# Patient Record
Sex: Female | Born: 2000 | Race: White | Hispanic: No | Marital: Single | Smoking: Never smoker
Health system: Southern US, Community
[De-identification: ages and names within clinical notes are randomized; demographics above are authoritative.]

## PROBLEM LIST (undated history)

## (undated) DIAGNOSIS — E049 Nontoxic goiter, unspecified: Secondary | ICD-10-CM

## (undated) DIAGNOSIS — F419 Anxiety disorder, unspecified: Secondary | ICD-10-CM

## (undated) DIAGNOSIS — R112 Nausea with vomiting, unspecified: Secondary | ICD-10-CM

## (undated) DIAGNOSIS — E039 Hypothyroidism, unspecified: Secondary | ICD-10-CM

## (undated) DIAGNOSIS — E301 Precocious puberty: Secondary | ICD-10-CM

## (undated) DIAGNOSIS — F988 Other specified behavioral and emotional disorders with onset usually occurring in childhood and adolescence: Secondary | ICD-10-CM

## (undated) DIAGNOSIS — K589 Irritable bowel syndrome without diarrhea: Secondary | ICD-10-CM

## (undated) DIAGNOSIS — Z9889 Other specified postprocedural states: Secondary | ICD-10-CM

## (undated) DIAGNOSIS — E063 Autoimmune thyroiditis: Secondary | ICD-10-CM

## (undated) DIAGNOSIS — A1801 Tuberculosis of spine: Secondary | ICD-10-CM

## (undated) DIAGNOSIS — E282 Polycystic ovarian syndrome: Secondary | ICD-10-CM

## (undated) DIAGNOSIS — Q796 Ehlers-Danlos syndrome, unspecified: Secondary | ICD-10-CM

## (undated) DIAGNOSIS — R51 Headache: Secondary | ICD-10-CM

## (undated) DIAGNOSIS — Z8709 Personal history of other diseases of the respiratory system: Secondary | ICD-10-CM

## (undated) HISTORY — DX: Irritable bowel syndrome without diarrhea: K58.9

## (undated) HISTORY — PX: UMBILICAL HERNIA REPAIR: SHX196

## (undated) HISTORY — PX: MULTIPLE TOOTH EXTRACTIONS: SHX2053

## (undated) HISTORY — DX: Precocious puberty: E30.1

## (undated) HISTORY — DX: Nontoxic goiter, unspecified: E04.9

## (undated) HISTORY — DX: Headache: R51

## (undated) HISTORY — DX: Polycystic ovarian syndrome: E28.2

## (undated) HISTORY — DX: Hypothyroidism, unspecified: E03.9

## (undated) HISTORY — DX: Ehlers-Danlos syndrome, unspecified: Q79.60

## (undated) HISTORY — DX: Tuberculosis of spine: A18.01

---
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
## 2020-01-24 ENCOUNTER — Other Ambulatory Visit: Payer: Self-pay | Admitting: Pediatrics

## 2020-01-24 DIAGNOSIS — L700 Acne vulgaris: Secondary | ICD-10-CM

## 2020-02-22 ENCOUNTER — Telehealth: Payer: Self-pay | Admitting: Internal Medicine

## 2020-02-22 NOTE — Telephone Encounter (Signed)
Pt's mother is requesting a call back to discuss the pt's complications with the nurse. Pt's mother states the pt is not able to eat solid foods since she is not able to hold anything in her stomach.

## 2020-02-22 NOTE — Telephone Encounter (Signed)
Patient's mom reports that patient is continuing to have IBS complaints and constipation.  She can't eat solid food.  If she has liquids she feels well.  If she eats solid food she has terrible abdominal pain.  She is away at work camp and will return late next week.  She will come in and see Dr. Leone Payor on 03/10/20 to discuss before she returns to college

## 2020-03-07 ENCOUNTER — Telehealth: Payer: Self-pay | Admitting: Internal Medicine

## 2020-03-07 NOTE — Telephone Encounter (Signed)
Left message for patient to call back  

## 2020-03-08 NOTE — Telephone Encounter (Signed)
Patient reports that she is severely constipated.  Unable to eat or drink anything due to nausea and vomiting.  She has been using glycerine suppositories with little relief.  She has an office visit on Friday with Dr. Leone Payor .  She will try taking 1/4 to 1/2 of her phenergan dose on a schedule and try Pedialyte and Gatorade for fluid replacement.  Mom asked about a large volume enema.  Patient used to perform prescribed by her pediatric GI. Mom will try this and keep the appt for Friday with Dr. Leone Payor

## 2020-03-08 NOTE — Telephone Encounter (Signed)
Left message for patient to call back  

## 2020-03-10 ENCOUNTER — Ambulatory Visit (INDEPENDENT_AMBULATORY_CARE_PROVIDER_SITE_OTHER): Payer: Medicaid Other | Admitting: Internal Medicine

## 2020-03-10 ENCOUNTER — Encounter: Payer: Self-pay | Admitting: Internal Medicine

## 2020-03-10 ENCOUNTER — Other Ambulatory Visit (INDEPENDENT_AMBULATORY_CARE_PROVIDER_SITE_OTHER): Payer: Medicaid Other

## 2020-03-10 VITALS — BP 100/74 | HR 71 | Ht 65.0 in | Wt 119.5 lb

## 2020-03-10 DIAGNOSIS — R5381 Other malaise: Secondary | ICD-10-CM | POA: Diagnosis not present

## 2020-03-10 DIAGNOSIS — R5383 Other fatigue: Secondary | ICD-10-CM | POA: Diagnosis not present

## 2020-03-10 DIAGNOSIS — F5082 Avoidant/restrictive food intake disorder: Secondary | ICD-10-CM

## 2020-03-10 DIAGNOSIS — K5909 Other constipation: Secondary | ICD-10-CM

## 2020-03-10 DIAGNOSIS — R112 Nausea with vomiting, unspecified: Secondary | ICD-10-CM

## 2020-03-10 LAB — SEDIMENTATION RATE: Sed Rate: 6 mm/hr (ref 0–20)

## 2020-03-10 LAB — COMPREHENSIVE METABOLIC PANEL
ALT: 16 U/L (ref 0–35)
AST: 13 U/L (ref 0–37)
Albumin: 4 g/dL (ref 3.5–5.2)
Alkaline Phosphatase: 95 U/L (ref 47–119)
BUN: 9 mg/dL (ref 6–23)
CO2: 28 mEq/L (ref 19–32)
Calcium: 9.5 mg/dL (ref 8.4–10.5)
Chloride: 106 mEq/L (ref 96–112)
Creatinine, Ser: 0.57 mg/dL (ref 0.40–1.20)
GFR: 135.88 mL/min (ref >60.00)
Glucose, Bld: 75 mg/dL (ref 70–99)
Potassium: 4 mEq/L (ref 3.5–5.1)
Sodium: 141 mEq/L (ref 135–145)
Total Bilirubin: 0.4 mg/dL (ref 0.2–1.2)
Total Protein: 7.2 g/dL (ref 6.0–8.3)

## 2020-03-10 LAB — CBC WITH DIFFERENTIAL/PLATELET
Basophils Absolute: 0 10*3/uL (ref 0.0–0.1)
Basophils Relative: 0.6 % (ref 0.0–3.0)
Eosinophils Absolute: 0.2 10*3/uL (ref 0.0–0.7)
Eosinophils Relative: 2.9 % (ref 0.0–5.0)
HCT: 45.5 % (ref 36.0–49.0)
Hemoglobin: 15.2 g/dL (ref 12.0–16.0)
Lymphocytes Relative: 24.5 % (ref 24.0–48.0)
Lymphs Abs: 1.8 10*3/uL (ref 0.7–4.0)
MCHC: 33.4 g/dL (ref 31.0–37.0)
MCV: 91.4 fl (ref 78.0–98.0)
Monocytes Absolute: 0.5 10*3/uL (ref 0.1–1.0)
Monocytes Relative: 7.4 % (ref 3.0–12.0)
Neutro Abs: 4.7 10*3/uL (ref 1.4–7.7)
Neutrophils Relative %: 64.6 % (ref 43.0–71.0)
Platelets: 270 10*3/uL (ref 150.0–575.0)
RBC: 4.98 Mil/uL (ref 3.80–5.70)
RDW: 13 % (ref 11.4–15.5)
WBC: 7.2 10*3/uL (ref 4.5–13.5)

## 2020-03-10 LAB — C-REACTIVE PROTEIN: CRP: <1 mg/dL (ref 0.5–20.0)

## 2020-03-10 LAB — TSH: TSH: 1.48 u[IU]/mL (ref 0.40–5.00)

## 2020-03-10 NOTE — Progress Notes (Signed)
Breanna Gaines 19 y.o. 2001/03/11 [id number]  Assessment & Plan:   Encounter Diagnoses  Name Primary?  . Nausea and vomiting in adult Yes  . Malaise and fatigue   . Chronic constipation   . Nausea and vomiting, intractability of vomiting not specified, unspecified vomiting type     Patient does not look unwell does not look dehydrated.  This sounds like an exacerbation of chronic symptoms unfortunately.  She had a work-up last year that included an EGD colonoscopy and capsule endoscopy all of which were normal.  My plan is for her to do a Sitzmarks study given the constipation.  She was able to swallow the capsule given to her by my medical assistant.  KUB in 5 days.  Consider increasing Linzess dosing.  She can continue her baby food and force liquids electrolytes Gatorade etc.  Try to advance diet as tolerated.   Have also schedule a gastric emptying study.  Mirtazapine might be a good treatment in the setting of severe nausea etc.  Pending ruling out other disorders.  No need to repeat endoscopic work-up in my opinion.   Orders Placed This Encounter  Procedures  . NM Gastric Emptying  . DG Abd 1 View  . CBC with Differential/Platelet  . Comprehensive metabolic panel  . Sedimentation rate  . TSH  . C-reactive protein   Labs returned and are all normal.  CC: Aggie Hacker, MD   Subjective:   Chief Complaint: Vomiting nausea constipation  HPI The patient is a 19 year old woman here with her mother, she has a history of what I thought was IBS and functional disturbance last seen 03/02/2019, now with 2 weeks of unable to eat except small pouches of baby foods, constant nausea, upper and lower abdominal bloating and distention after eating and using 3 Dulcolax tablets to move her bowels about every other day.  Still on Linzess 145 mcg daily.  Says she cannot really swallow any regular food without regurgitating it.  Says she is lost 5 pounds in the last month.  It  sounds like she had symptoms throughout her college year last year, though she never sought medical help during that time.  Mother interjects with significant portions of the history as well and is concerned about whether or not she needs something to give her electrolytes because of terrible fatigue that is observed.  Patient is going to visit college friends today and this weekend coming up.  Has been taking Phenergan most nights to sleep and relieve nausea.  She was recently seen at her pediatrician's office for a viral syndrome and tested negative for Covid I am told.  Wt Readings from Last 3 Encounters:  03/10/20 119 lb 8 oz (54.2 kg) (34 %, Z= -0.42)*  03/02/19 113 lb 9.6 oz (51.5 kg) (26 %, Z= -0.65)*  12/17/18 105 lb (47.6 kg) (11 %, Z= -1.25)*   * Growth percentiles are based on CDC (Girls, 2-20 Years) data.     Allergies  Allergen Reactions  . Coconut Flavor   . Dairy Aid [Lactase]   . Gluten Meal Nausea Only  . Penicillins Rash   Current Meds  Medication Sig  . Amphetamine Sulfate 10 MG TABS TAKE 1/2 - 1 TABLETS BY MOUTH TWICE A DAY  . hyoscyamine (LEVSIN SL) 0.125 MG SL tablet DISSOLVE 1 TABLET ON THE TONGUE BEFORE MEALS FOR ABDOMINAL CRAMPING AND SPASMS.  Marland Kitchen ibuprofen (ADVIL) 400 MG tablet Take 400 mg by mouth every 6 (six) hours as needed.  Marland Kitchen  linaclotide (LINZESS) 145 MCG CAPS capsule Take 1 capsule (145 mcg total) by mouth daily before breakfast.  . Omeprazole (PRILOSEC PO) Take 1 tablet by mouth daily.  . ondansetron (ZOFRAN) 4 MG tablet Take 1 tab by mouth every 6 hours as needed for nausea and vomiting.  . promethazine (PHENERGAN) 12.5 MG tablet Take one-two every 6 -8 hours as needed for nausea  . rifaximin (XIFAXAN) 550 MG TABS tablet Take 1 tablet (550 mg total) by mouth 3 (three) times daily.  . sertraline (ZOLOFT) 50 MG tablet Take 1 tablet (50 mg total) by mouth daily.  Marland Kitchen spironolactone (ALDACTONE) 50 MG tablet TAKE 1 TABLET BY MOUTH TWICE A DAY  . SPRINTEC 28  0.25-35 MG-MCG tablet TAKE 1 TABLET BY MOUTH EVERY DAY   Current Facility-Administered Medications for the 03/10/20 encounter (Office Visit) with Iva Boop, MD  Medication  . ibuprofen (ADVIL,MOTRIN) tablet 400 mg   Past Medical History:  Diagnosis Date  . ADD (attention deficit disorder)   . Anxiety    while undergoing anesthesia with mask  . Goiter   . Hashimoto's thyroiditis   . Headache(784.0)   . History of asthma    has not required meds. in years  . Hypothyroidism   . IBS (irritable bowel syndrome) 11/06/2017  . Isosexual precocity   . Post-operative nausea and vomiting   . Precocious puberty 01/07/2011   Past Surgical History:  Procedure Laterality Date  . GIVENS CAPSULE STUDY N/A 03/04/2019   Procedure: GIVENS CAPSULE STUDY;  Surgeon: Iva Boop, MD;  Location: [facility] ENDOSCOPY;  Service: Endoscopy;  Laterality: N/A;  . MASS EXCISION Right 02/22/2013   Procedure: right arm implant removal  ;  Surgeon: Emelia Loron, MD;  Location: [facility];  Service: General;  Laterality: Right;  . MULTIPLE TOOTH EXTRACTIONS    . SUPPRELIN IMPLANT  02/12/2010  . TONSILLECTOMY AND ADENOIDECTOMY  2007  . UMBILICAL HERNIA REPAIR    . URACHAL CYST EXCISION  01/06/2001   Social History   Social History Narrative   Single, no children to graduate high school 2020.   Lives with parents.   No alcohol tobacco or drug use       family history includes Breast cancer in her maternal aunt and maternal grandmother; Cancer in her paternal grandmother; Dementia in her paternal grandfather; Early puberty in her mother; Hypertension in her maternal grandfather; Migraines in her maternal grandmother and mother; Transient ischemic attack in her mother.   Review of Systems As per HPI  Objective:   Physical Exam BP 100/74   Pulse 71   Ht 5\' 5"  (1.651 m)   Wt 119 lb 8 oz (54.2 kg)   BMI 19.89 kg/m  Petite young white woman no acute distress looks well mucous membranes are  moist.  No signs of jaundice. Lungs clear normal heart sounds Abdomen is soft nontender without organomegaly or mass  Data reviewed see HPI see assessment and plan

## 2020-03-10 NOTE — Patient Instructions (Addendum)
Your provider has requested that you go to the basement level for lab work before leaving today. Press "B" on the elevator. The lab is located at the first door on the left as you exit the elevato   Please continue foods as tolerated.   Today you are swallowing a Sitzmarker, Please read the package instructions, no laxatives until after you come back August 25th for an x-ray. Try to come around 11:00 AM. Thank you.    You have been scheduled for a gastric emptying scan at [facility] on 03/17/2020 at 8:30AM. Please arrive at least 15 minutes prior to your appointment for registration. Please make certain not to have anything to eat or drink after midnight the night before your test. Hold all stomach medications (ex: Zofran, phenergan, Reglan) 48 hours prior to your test. If you need to reschedule your appointment, please contact radiology scheduling at [phone]. _____________________________________________________________________ A gastric-emptying study measures how long it takes for food to move through your stomach. There are several ways to measure stomach emptying. In the most common test, you eat food that contains a small amount of radioactive material. A scanner that detects the movement of the radioactive material is placed over your abdomen to monitor the rate at which food leaves your stomach. This test normally takes about 4 hours to complete. _____________________________________________________________________  I appreciate the opportunity to care for you. Stan Head, MD, [facility]

## 2020-03-15 ENCOUNTER — Other Ambulatory Visit: Payer: Self-pay

## 2020-03-15 ENCOUNTER — Ambulatory Visit (INDEPENDENT_AMBULATORY_CARE_PROVIDER_SITE_OTHER)
Admission: RE | Admit: 2020-03-15 | Discharge: 2020-03-15 | Disposition: A | Discharge status: Home / Self Care | Payer: Medicaid Other | Source: Ambulatory Visit | Attending: Internal Medicine | Admitting: Internal Medicine

## 2020-03-15 DIAGNOSIS — K5909 Other constipation: Secondary | ICD-10-CM

## 2020-03-17 ENCOUNTER — Encounter (HOSPITAL_COMMUNITY)
Admission: RE | Admit: 2020-03-17 | Discharge: 2020-03-17 | Disposition: A | Discharge status: Home / Self Care | Payer: Medicaid Other | Source: Ambulatory Visit | Attending: Internal Medicine | Admitting: Internal Medicine

## 2020-03-17 ENCOUNTER — Other Ambulatory Visit: Payer: Self-pay

## 2020-03-17 DIAGNOSIS — R112 Nausea with vomiting, unspecified: Secondary | ICD-10-CM | POA: Diagnosis not present

## 2020-03-17 MED ORDER — TECHNETIUM TC 99M SULFUR COLLOID
1.9000 | Freq: Once | INTRAVENOUS | Status: AC | PRN
  Administered 2020-03-17: 1.9 via INTRAVENOUS

## 2020-06-12 ENCOUNTER — Other Ambulatory Visit: Payer: Self-pay | Admitting: Family

## 2020-06-12 ENCOUNTER — Other Ambulatory Visit: Payer: Self-pay | Admitting: Internal Medicine

## 2020-06-12 DIAGNOSIS — L700 Acne vulgaris: Secondary | ICD-10-CM

## 2020-06-12 DIAGNOSIS — N914 Secondary oligomenorrhea: Secondary | ICD-10-CM

## 2020-07-24 ENCOUNTER — Telehealth: Payer: Self-pay | Admitting: Internal Medicine

## 2020-07-24 MED ORDER — PROMETHAZINE HCL 25 MG RE SUPP: 25.0000 mg | Freq: Two times a day (BID) | RECTAL | 0 refills | Status: AC | PRN

## 2020-07-24 NOTE — Telephone Encounter (Signed)
Yes, that would be fine.  Phenergan suppository 25 mg  Use 1 every 12 hours as needed for nausea and vomiting  Dispense 12, refill 0   - HD

## 2020-07-24 NOTE — Telephone Encounter (Signed)
rx sent Mom notified

## 2020-07-24 NOTE — Telephone Encounter (Signed)
Inbound call from patient's mother stating patient has been vomiting, has stomach pain and has not had a bowel movement on a couple of days.  Please advise.

## 2020-07-24 NOTE — Telephone Encounter (Signed)
Dr. Leone Payor patient with a continued hx of chronic constipation.  She has stopped the linzess a few months ago due to not working.  She is scheduled to see Doug Sou, PA on Wed on 07/26/20.  Patient has not had a BM in 10 days. Only thing that helps her have a BM is glycerine suppositories and enemas.  She is vomiting.  Mom is requesting phenergan suppository.  She has po phenergan and zofran at home, but can't keep them down.  Dr. Myrtie Neither, you are MD of the day.  okay to send in phenergan suppository?

## 2020-07-26 ENCOUNTER — Encounter: Payer: Self-pay | Admitting: Gastroenterology

## 2020-07-26 ENCOUNTER — Ambulatory Visit (INDEPENDENT_AMBULATORY_CARE_PROVIDER_SITE_OTHER): Payer: Medicaid Other | Admitting: Gastroenterology

## 2020-07-26 VITALS — BP 100/70 | HR 88 | Ht 64.57 in | Wt 118.2 lb

## 2020-07-26 DIAGNOSIS — R1084 Generalized abdominal pain: Secondary | ICD-10-CM | POA: Diagnosis not present

## 2020-07-26 DIAGNOSIS — K5909 Other constipation: Secondary | ICD-10-CM

## 2020-07-26 DIAGNOSIS — R112 Nausea with vomiting, unspecified: Secondary | ICD-10-CM

## 2020-07-26 MED ORDER — LINACLOTIDE 290 MCG PO CAPS: 290.0000 ug | ORAL_CAPSULE | Freq: Every day | ORAL | 5 refills | Status: DC

## 2020-07-26 NOTE — Progress Notes (Signed)
07/26/2020 OMYA WINFIELD [id number] 2000/12/24   HISTORY OF PRESENT ILLNESS: This is a 20 year old female who is a patient of Dr. Marvell Fuller.  She presents here today with her mother for ongoing complaints of nausea, vomiting, and constipation, abdominal pain.  She has had all of the symptoms for years.  Previously saw pediatric GI and is now here.  EGD, colonoscopy, capsule endoscopy, ultrasounds, CT scans, gastric emptying scan have all been unremarkable except for constipation.  Sitz marker study showed large colonic stool burden.  She tried Linzess 145 mcg daily in the past and says that it worked for a while, but then stopped working so she discontinued it.  She says that MiraLAX previously did not work at all.  She hass tried changing her diet several different ways without success.  She tells me that her last bowel movement was 8 or 9 days ago.  She says that she has nausea all the time and relies on Phenergan and Zofran every 6 hours.  She is not able to eat anything except for pured fruits and vegetables or babyfood type products without vomiting or having abdominal pain.  Her weight is down a pound from her last visit with Dr. Leone Payor in August.  Her mother reports that they are frustrated and wondering at which point we will refer to another type of specialist.    Past Medical History:  Diagnosis Date  . ADD (attention deficit disorder)   . Anxiety    while undergoing anesthesia with mask  . Goiter   . Hashimoto's thyroiditis   . Headache(784.0)   . History of asthma    has not required meds. in years  . Hypothyroidism   . IBS (irritable bowel syndrome) 11/06/2017  . Isosexual precocity   . Post-operative nausea and vomiting   . Precocious puberty 01/07/2011   Past Surgical History:  Procedure Laterality Date  . GIVENS CAPSULE STUDY N/A 03/04/2019   Procedure: GIVENS CAPSULE STUDY;  Surgeon: Iva Boop, MD;  Location: [facility] ENDOSCOPY;  Service: Endoscopy;   Laterality: N/A;  . MASS EXCISION Right 02/22/2013   Procedure: right arm implant removal  ;  Surgeon: Emelia Loron, MD;  Location: [facility];  Service: General;  Laterality: Right;  . MULTIPLE TOOTH EXTRACTIONS    . SUPPRELIN IMPLANT  02/12/2010  . TONSILLECTOMY AND ADENOIDECTOMY  2007  . UMBILICAL HERNIA REPAIR    . URACHAL CYST EXCISION  01/06/2001    reports that she has never smoked. She has never used smokeless tobacco. She reports that she does not drink alcohol and does not use drugs. family history includes Breast cancer in her maternal aunt and maternal grandmother; Cancer in her paternal grandmother; Dementia in her paternal grandfather; Early puberty in her mother; Hypertension in her maternal grandfather; Migraines in her maternal grandmother and mother; Transient ischemic attack in her mother. Allergies  Allergen Reactions  . Coconut Flavor   . Dairy Aid [Lactase]   . Gluten Meal Nausea Only  . Penicillins Rash      Outpatient Encounter Medications as of 07/26/2020  Medication Sig  . Amphetamine Sulfate 10 MG TABS TAKE 1/2 - 1 TABLETS BY MOUTH TWICE A DAY  . hyoscyamine (LEVSIN SL) 0.125 MG SL tablet DISSOLVE 1 TABLET ON THE TONGUE BEFORE MEALS FOR ABDOMINAL CRAMPING AND SPASMS.  Marland Kitchen ibuprofen (ADVIL) 400 MG tablet Take 400 mg by mouth every 6 (six) hours as needed.  . Omeprazole (PRILOSEC PO) Take 1 tablet by  mouth daily.  . ondansetron (ZOFRAN) 4 MG tablet Take 1 tab by mouth every 6 hours as needed for nausea and vomiting.  . promethazine (PHENERGAN) 12.5 MG tablet TAKE ONE-TWO TABS EVERY 6 -8 HOURS AS NEEDED FOR NAUSEA  . promethazine (PHENERGAN) 25 MG suppository Place 1 suppository (25 mg total) rectally every 12 (twelve) hours as needed for nausea or vomiting.  . rifaximin (XIFAXAN) 550 MG TABS tablet Take 1 tablet (550 mg total) by mouth 3 (three) times daily.  . sertraline (ZOLOFT) 50 MG tablet Take 1 tablet (50 mg total) by mouth daily.  Marland Kitchen  spironolactone (ALDACTONE) 50 MG tablet TAKE 1 TABLET BY MOUTH TWICE A DAY  . SPRINTEC 28 0.25-35 MG-MCG tablet TAKE 1 TABLET BY MOUTH EVERY DAY  . [DISCONTINUED] linaclotide (LINZESS) 145 MCG CAPS capsule Take 1 capsule (145 mcg total) by mouth daily before breakfast.   Facility-Administered Encounter Medications as of 07/26/2020  Medication  . ibuprofen (ADVIL,MOTRIN) tablet 400 mg     REVIEW OF SYSTEMS  : All other systems reviewed and negative except where noted in the History of Present Illness.   PHYSICAL EXAM: BP 100/70 (BP Location: Left Arm, Patient Position: Sitting, Cuff Size: Normal)   Pulse 88   Ht 5' 4.57" (1.64 m) Comment: height measured without shoes  Wt 118 lb 4 oz (53.6 kg)   LMP 07/19/2020   BMI 19.94 kg/m  General: Well developed white female in no acute distress Head: Normocephalic and atraumatic Eyes:  Sclerae anicteric, conjunctiva pink. Ears: Normal auditory acuity Lungs: Clear throughout to auscultation; no W/R/R. Heart: Regular rate and rhythm; no M/R/G. Abdomen: Soft, non-distended.  BS present.  Mild diffuse TTP. Musculoskeletal: Symmetrical with no gross deformities  Skin: No lesions on visible extremities Extremities: No edema  Neurological: Alert oriented x 4, grossly non-focal Psychological:  Alert and cooperative. Normal mood and affect  ASSESSMENT AND PLAN: *20 year old female with chronic complaints of nausea and vomiting, constipation, abdominal pain: Symptoms ongoing for years. Previously seen peds GI and is now here. EGD, colonoscopy, video capsule endoscopy, gastric emptying scan, CT scans, ultrasounds all been unremarkable except for showing severe constipation. Sitz marker study was positive for large stool burden. Linzess 145 mcg helped previously for short time. We discussed trying this again at a higher dose. Also have lots of other options including Amitiza, Trulance, Movantik, etc. I have asked her to do a bowel cleanse tonight she was  given a bowel prep sample. She will also use some glycerin suppositories or fleets enema this evening and then begin her Linzess daily tomorrow. She was given samples and prescription was sent to her pharmacy. We will also check a HIDA scan due to her complaints of nausea, vomiting, and abdominal pain with eating anything except for pured fruits and vegetables/baby foods.   CC:  Aggie Hacker, MD

## 2020-07-26 NOTE — Patient Instructions (Addendum)
If you are age 20 or older, your body mass index should be between 23-30. Your Body mass index is 19.94 kg/m. If this is out of the aforementioned range listed, please consider follow up with your Primary Care Provider.  If you are age 7 or younger, your body mass index should be between 19-25. Your Body mass index is 19.94 kg/m. If this is out of the aformentioned range listed, please consider follow up with your Primary Care Provider.   We have sent the following medications to your pharmacy for you to pick up at your convenience: Linzess 290 ,mcg  You have been scheduled for a HIDA scan at [facility] (1st floor) on 08/14/20. Please arrive 15 minutes prior to your scheduled appointment at  7:30. Make certain not to have anything to eat or drink at least 6 hours prior to your test. Should this appointment date or time not work well for you, please call radiology scheduling at [phone].  _____________________________________________________________________ hepatobiliary (HIDA) scan is an imaging procedure used to diagnose problems in the liver, gallbladder and bile ducts. In the HIDA scan, a radioactive chemical or tracer is injected into a vein in your arm. The tracer is handled by the liver like bile. Bile is a fluid produced and excreted by your liver that helps your digestive system break down fats in the foods you eat. Bile is stored in your gallbladder and the gallbladder releases the bile when you eat a meal. A special nuclear medicine scanner (gamma camera) tracks the flow of the tracer from your liver into your gallbladder and small intestine.  During your HIDA scan  You'll be asked to change into a hospital gown before your HIDA scan begins. Your health care team will position you on a table, usually on your back. The radioactive tracer is then injected into a vein in your arm.The tracer travels through your bloodstream to your liver, where it's taken up by the bile-producing  cells. The radioactive tracer travels with the bile from your liver into your gallbladder and through your bile ducts to your small intestine.You may feel some pressure while the radioactive tracer is injected into your vein. As you lie on the table, a special gamma camera is positioned over your abdomen taking pictures of the tracer as it moves through your body. The gamma camera takes pictures continually for about an hour. You'll need to keep still during the HIDA scan. This can become uncomfortable, but you may find that you can lessen the discomfort by taking deep breaths and thinking about other things. Tell your health care team if you're uncomfortable. The radiologist will watch on a computer the progress of the radioactive tracer through your body. The HIDA scan may be stopped when the radioactive tracer is seen in the gallbladder and enters your small intestine. This typically takes about an hour. In some cases extra imaging will be performed if original images aren't satisfactory, if morphine is given to help visualize the gallbladder or if the medication CCK is given to look at the contraction of the gallbladder. This test typically takes 2 hours to complete. ________________________________________________________________________  Start the bowel prep this evening w/enema or suppositories.  Thank you for choosing me and [facility].  Breanna Gaines

## 2020-08-14 ENCOUNTER — Encounter (HOSPITAL_COMMUNITY): Admission: RE | Admit: 2020-08-14 | Payer: Medicaid Other | Source: Ambulatory Visit

## 2020-10-17 ENCOUNTER — Telehealth: Payer: Self-pay | Admitting: Internal Medicine

## 2020-10-23 NOTE — Telephone Encounter (Signed)
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

## 2020-10-26 ENCOUNTER — Other Ambulatory Visit: Payer: Self-pay

## 2020-10-26 ENCOUNTER — Ambulatory Visit (INDEPENDENT_AMBULATORY_CARE_PROVIDER_SITE_OTHER): Payer: Medicaid Other | Admitting: Pediatrics

## 2020-10-26 ENCOUNTER — Other Ambulatory Visit (HOSPITAL_COMMUNITY)
Admission: RE | Admit: 2020-10-26 | Discharge: 2020-10-26 | Disposition: A | Discharge status: Home / Self Care | Payer: Medicaid Other | Source: Ambulatory Visit | Attending: Pediatrics | Admitting: Pediatrics

## 2020-10-26 ENCOUNTER — Encounter: Payer: Self-pay | Admitting: Pediatrics

## 2020-10-26 VITALS — BP 118/76 | HR 97 | Ht 65.0 in | Wt 118.8 lb

## 2020-10-26 DIAGNOSIS — Z113 Encounter for screening for infections with a predominantly sexual mode of transmission: Secondary | ICD-10-CM

## 2020-10-26 DIAGNOSIS — K5909 Other constipation: Secondary | ICD-10-CM

## 2020-10-26 DIAGNOSIS — F900 Attention-deficit hyperactivity disorder, predominantly inattentive type: Secondary | ICD-10-CM

## 2020-10-26 DIAGNOSIS — G90A Postural orthostatic tachycardia syndrome (POTS): Secondary | ICD-10-CM

## 2020-10-26 DIAGNOSIS — R112 Nausea with vomiting, unspecified: Secondary | ICD-10-CM

## 2020-10-26 DIAGNOSIS — L680 Hirsutism: Secondary | ICD-10-CM | POA: Diagnosis not present

## 2020-10-26 DIAGNOSIS — E282 Polycystic ovarian syndrome: Secondary | ICD-10-CM

## 2020-10-26 DIAGNOSIS — I498 Other specified cardiac arrhythmias: Secondary | ICD-10-CM

## 2020-10-26 DIAGNOSIS — L700 Acne vulgaris: Secondary | ICD-10-CM | POA: Diagnosis not present

## 2020-10-26 DIAGNOSIS — F411 Generalized anxiety disorder: Secondary | ICD-10-CM

## 2020-10-26 MED ORDER — LUBIPROSTONE 24 MCG PO CAPS: 24.0000 ug | ORAL_CAPSULE | Freq: Two times a day (BID) | ORAL | 1 refills | Status: DC

## 2020-10-26 MED ORDER — POLYETHYLENE GLYCOL 3350 17 GM/SCOOP PO POWD: 1.0000 | Freq: Once | ORAL | 0 refills | Status: AC

## 2020-10-26 MED ORDER — AMPHETAMINE SULFATE 10 MG PO TABS: ORAL_TABLET | ORAL | 0 refills | Status: DC

## 2020-10-26 MED ORDER — SERTRALINE HCL 50 MG PO TABS: 50.0000 mg | ORAL_TABLET | Freq: Every day | ORAL | 1 refills | Status: DC

## 2020-10-26 MED ORDER — NORELGESTROMIN-ETH ESTRADIOL 150-35 MCG/24HR TD PTWK: 1.0000 | MEDICATED_PATCH | TRANSDERMAL | 3 refills | Status: DC

## 2020-10-26 MED ORDER — AMITRIPTYLINE HCL 10 MG PO TABS: ORAL_TABLET | ORAL | 1 refills | Status: DC

## 2020-10-26 NOTE — Progress Notes (Signed)
POTS vitals collected

## 2020-10-26 NOTE — Progress Notes (Signed)
History was provided by the patient.  Breanna Gaines is a 20 y.o. female who is here for PCOS, anxiety, ADHD, acne, hirsutism.  Breanna Richele Strand, MD   HPI:  Pt reports that she is doing well. 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 Living on campus all 4 years.   She is still having really severe GI problems. She is going in 2 weeks to [facility] to see someone new. Got sick sophomore year of high school- this is when we started OCP. Nauseous and throwing up most nights. Says coffee helps with her stomach.   She is not remembering her birth control on a regular basis. Her stomach problem does not seem to be improved on the days where she doesn't take it.   Anxiety 2/10. Feels like zoloft has continued to work well for her anxiety. She has continued evekeo for ADHD, though not consistently.   She has been passing out a lot recently. This school year she has passed out 6-7 times. She gets really dizzy, nauseous, hot, and has to lay down and seems to have heart racing. She drinks about 36-40 oz of water during the day + some at meals. Coffee in the AM. No regular ETOH, sports drinks or sodas.  In the last week, only had zofran once or twice. Phenergan once or twice. Has not taken linzess this week. Reports GI told her to use it on a week to week basis. When she was on the 290 mcg she took for 1 week- had a few days of diarrhea and then reports it stopped working abruptly so she stopped it. Last BM was on Monday. Last vomit 2 nights ago, usually overnight.   Last migraine a long time ago.    Stopped taking spiro a while ago in an effort to minimize overall # of medications   No LMP recorded.  Review of Systems  Constitutional: Positive for malaise/fatigue.  Eyes: Negative for double vision.  Respiratory: Negative for shortness of breath.   Cardiovascular: Negative for chest pain and palpitations.  Gastrointestinal: Positive for abdominal pain, constipation, nausea and vomiting.  Negative for diarrhea.  Genitourinary: Negative for dysuria.  Musculoskeletal: Negative for joint pain and myalgias.  Skin: Negative for rash.  Neurological: Positive for dizziness. Negative for headaches.  Endo/Heme/Allergies: Does not bruise/bleed easily.    Patient Active Problem List   Diagnosis Date Noted  . Chronic constipation 07/26/2020  . Generalized abdominal pain 07/26/2020  . Nausea and vomiting in adult 12/17/2018  . Avoidant-restrictive food intake disorder (ARFID) ??? 11/05/2018  . Food intolerance 12/12/2017  . Hirsutism 12/12/2017  . Acne 12/11/2017  . IBS (irritable bowel syndrome) 11/06/2017  . Folliculitis 06/26/2015  . Oligomenorrhea 05/03/2014  . Chronic tension type headache 12/28/2013  . Migraine without aura, without mention of intractable migraine without mention of status migrainosus 12/28/2013  . Panic disorder 12/28/2013  . Thyroiditis, autoimmune   . Goiter, unspecified 01/07/2011    Current Outpatient Medications on File Prior to Visit  Medication Sig Dispense Refill  . Amphetamine Sulfate 10 MG TABS TAKE 1/2 - 1 TABLETS BY MOUTH TWICE A DAY  0  . hyoscyamine (LEVSIN SL) 0.125 MG SL tablet DISSOLVE 1 TABLET ON THE TONGUE BEFORE MEALS FOR ABDOMINAL CRAMPING AND SPASMS. 90 tablet 2  . ibuprofen (ADVIL) 400 MG tablet Take 400 mg by mouth every 6 (six) hours as needed.    . linaclotide (LINZESS) 290 MCG CAPS capsule Take 1 capsule (290 mcg total) by  mouth daily. 30 capsule 5  . Omeprazole (PRILOSEC PO) Take 1 tablet by mouth daily.    . ondansetron (ZOFRAN) 4 MG tablet Take 1 tab by mouth every 6 hours as needed for nausea and vomiting. 100 tablet 0  . promethazine (PHENERGAN) 12.5 MG tablet TAKE ONE-TWO TABS EVERY 6 -8 HOURS AS NEEDED FOR NAUSEA 45 tablet 0  . promethazine (PHENERGAN) 25 MG suppository Place 1 suppository (25 mg total) rectally every 12 (twelve) hours as needed for nausea or vomiting. 12 each 0  . rifaximin (XIFAXAN) 550 MG TABS tablet  Take 1 tablet (550 mg total) by mouth 3 (three) times daily. 42 tablet 0  . sertraline (ZOLOFT) 50 MG tablet Take 1 tablet (50 mg total) by mouth daily.    Marland Kitchen spironolactone (ALDACTONE) 50 MG tablet TAKE 1 TABLET BY MOUTH TWICE A DAY 60 tablet 5  . SPRINTEC 28 0.25-35 MG-MCG tablet TAKE 1 TABLET BY MOUTH EVERY DAY 84 tablet 3   Current Facility-Administered Medications on File Prior to Visit  Medication Dose Route Frequency Provider Last Rate Last Admin  . ibuprofen (ADVIL,MOTRIN) tablet 400 mg  400 mg Oral 4 times per day Owens Shark, MD        Allergies  Allergen Reactions  . Coconut Flavor   . Dairy Aid [Lactase]   . Gluten Meal Nausea Only  . Penicillins Rash    Social History: Confidentiality was discussed with the patient and if applicable, with caregiver as well. Tobacco: none Secondhand smoke exposure? no Drugs/EtOH: ETOH occasionally, no MJ Sexually active? no  Safety: safe at home and school  Last STI Screening:today  Pregnancy Prevention: ocp  Physical Exam:    Vitals:   10/26/20 1144  BP: 118/85  Pulse: 79  Weight: 118 lb 12.8 oz (53.9 kg)  Height: 5\' 5"  (1.651 m)    Growth percentile SmartLinks can only be used for patients less than 65 years old.  Physical Exam Vitals and nursing note reviewed.  Constitutional:      General: She is not in acute distress.    Appearance: She is well-developed.  Neck:     Thyroid: No thyromegaly.  Cardiovascular:     Rate and Rhythm: Normal rate and regular rhythm.     Heart sounds: No murmur heard.   Pulmonary:     Breath sounds: Normal breath sounds.  Abdominal:     Palpations: Abdomen is soft. There is no mass.     Tenderness: There is no abdominal tenderness. There is no guarding.  Musculoskeletal:     Right lower leg: No edema.     Left lower leg: No edema.  Lymphadenopathy:     Cervical: No cervical adenopathy.  Skin:    General: Skin is warm.     Findings: No rash.  Neurological:     Mental  Status: She is alert.     Comments: No tremor  Psychiatric:        Mood and Affect: Mood and affect normal.     Assessment/Plan: 1. PCOS (polycystic ovarian syndrome) Would like to switch to patch so she can have consistent hormones given that sometimes she forgets and sometimes she vomits after dose. We will try continuous cycling patch given that her severe stomach symptoms used to occur in monthly cycles to address if they are at all related to a catamenial response.  - norelgestromin-ethinyl estradiol (ORTHO EVRA) 150-35 MCG/24HR transdermal patch; Place 1 patch onto the skin once a week. Place 1 patch  onto the skin once weekly. Do not skip a week, wear patch weekly for continuous cycling  Dispense: 16 patch; Refill: 3  2. Hirsutism Stable. Not currently on spiro.   3. Acne vulgaris Stable.   4. Routine screening for STI (sexually transmitted infection) Per protocol.  - Urine cytology ancillary only  5. Chronic constipation Not taking linzess consistently. Discussed in light of POTS dx today and correlation between POTS and autonomic dysfunction of the GI tract we should find something that she can stay on and take consistently to help with this, not on a week to week basis. She was in agreement. She will see GI at [facility] in 2 weeks as well.  - lubiprostone (AMITIZA) 24 MCG capsule; Take 1 capsule (24 mcg total) by mouth 2 (two) times daily with a meal.  Dispense: 60 capsule; Refill: 1  6. Nausea and vomiting in adult Will try amitriptyline to help with n/v symtpoms. Discussed also adding peppermint oil and digestive enzymes. Work to limit zofran and phenergan use as these can worsen constipation. Will get EKG prior to starting since she is on multiple meds that could cause prolonged qtc  - amitriptyline (ELAVIL) 10 MG tablet; Take 1 tablet (10 mg total) by mouth at bedtime for 7 days, THEN 2 tablets (20 mg total) at bedtime for 23 days.  Dispense: 53 tablet; Refill: 1  7. POTS  (postural orthostatic tachycardia syndrome) Given description of sx r/t dizziness and ongoing chronic GI issues, POTS vitals were completed and are consistent with this dx. Discussed lifestyle changes and handout provided regarding sodium and fluid intake goals.  - EKG 12-Lead  8. GAD (generalized anxiety disorder) Continue sertraline at same dose for now.  - sertraline (ZOLOFT) 50 MG tablet; Take 1 tablet (50 mg total) by mouth daily.  Dispense: 90 tablet; Refill: 1  9. Attention deficit hyperactivity disorder (ADHD), predominantly inattentive type Continue evekeo.  - Amphetamine Sulfate 10 MG TABS; TAKE 1/2 - 1 TABLETS BY MOUTH TWICE A DAY  Dispense: 60 tablet; Refill: 0  Return in 4 weeks, communicate by mychart until she is finished with school in [facility].   Alfonso Ramus, FNP

## 2020-10-26 NOTE — Patient Instructions (Addendum)
Send me a message in 1 week and let me know how you are doing. Come back and see me when you get back at the beginning of summer break in May   EKG [phone]. Please get before you start amitriptyline   Start amitriptyline 10 mg every night. After 1 week, increase to 20 mg nightly if not too tired   Try your best to minimize zofran and phenergan- try taking the peppermint oil as below 3 times daily. Would also consider adding digestive enzymes.    Continuous cycle birth control patch   miralax cleanout this weekend- mix with gatorade. When you are done with this, start amitiza 24 mcg twice daily with food and water. It will be important for you to stay on this really consistently to help long-term with constipation.   Supplements and Habits that may be helpful for POTS Syndrome  For Digestive Issues: Peppermint Oil 0.2 to 0.4 mL in enteric-coated capsules 3 times daily   For Dizziness and Fatigue: Electrolyte Recipe 1 -2 cups water Juice of  lemon 1/4 tsp real sea salt, Himalayan salt, or Celtic sea salt 2 tsp raw honey  Water Goal: 2-3 Liters per day Salt Goal: 3-5 grams of salt per day (1 tsp of salt = approximately 6 grams) Liquid IV, NUUN tablets, pedialyte etc have good sodium

## 2020-10-27 LAB — URINE CYTOLOGY ANCILLARY ONLY
Chlamydia: NEGATIVE
Comment: NEGATIVE
Comment: NORMAL
Neisseria Gonorrhea: NEGATIVE

## 2020-10-30 ENCOUNTER — Other Ambulatory Visit: Payer: Self-pay | Admitting: Pediatrics

## 2020-10-30 DIAGNOSIS — K5909 Other constipation: Secondary | ICD-10-CM

## 2020-10-30 MED ORDER — AMITIZA 24 MCG PO CAPS: 24.0000 ug | ORAL_CAPSULE | Freq: Two times a day (BID) | ORAL | 1 refills | Status: DC

## 2020-11-01 ENCOUNTER — Telehealth: Payer: Self-pay

## 2020-11-01 NOTE — Telephone Encounter (Signed)
Prior auth approved

## 2020-11-01 NOTE — Telephone Encounter (Signed)
Prior auth submitted

## 2020-11-01 NOTE — Telephone Encounter (Signed)
-----   Message from Verneda Skill, FNP sent at 10/30/2020  1:03 PM EDT ----- Can you do a PA for the [facility]? I'm thinking maybe it's covermymeds now since it's a medicaid bcbs plan? I couldn't figure it out. She has taken adderall and focalin

## 2020-11-10 ENCOUNTER — Ambulatory Visit (HOSPITAL_COMMUNITY)
Admission: RE | Admit: 2020-11-10 | Discharge: 2020-11-10 | Disposition: A | Discharge status: Home / Self Care | Payer: Medicaid Other | Source: Ambulatory Visit | Attending: Pediatrics | Admitting: Pediatrics

## 2020-11-10 ENCOUNTER — Other Ambulatory Visit: Payer: Self-pay

## 2020-11-10 DIAGNOSIS — I498 Other specified cardiac arrhythmias: Secondary | ICD-10-CM | POA: Diagnosis not present

## 2020-11-20 ENCOUNTER — Other Ambulatory Visit: Payer: Self-pay | Admitting: Pediatrics

## 2020-11-20 DIAGNOSIS — R112 Nausea with vomiting, unspecified: Secondary | ICD-10-CM

## 2020-11-21 ENCOUNTER — Other Ambulatory Visit: Payer: Self-pay | Admitting: Pediatrics

## 2020-12-14 ENCOUNTER — Other Ambulatory Visit: Payer: Self-pay | Admitting: Pediatrics

## 2020-12-14 DIAGNOSIS — G90A Postural orthostatic tachycardia syndrome (POTS): Secondary | ICD-10-CM

## 2020-12-14 DIAGNOSIS — I498 Other specified cardiac arrhythmias: Secondary | ICD-10-CM

## 2020-12-14 MED ORDER — SODIUM CHLORIDE 1 G PO TABS: 1.0000 g | ORAL_TABLET | Freq: Three times a day (TID) | ORAL | 3 refills | Status: DC

## 2021-02-05 ENCOUNTER — Other Ambulatory Visit: Payer: Self-pay | Admitting: Pediatrics

## 2021-02-05 MED ORDER — CLINDAMYCIN PHOSPHATE 1 % EX SOLN: CUTANEOUS | 1 refills | Status: AC

## 2021-03-05 ENCOUNTER — Other Ambulatory Visit: Payer: Self-pay | Admitting: Pediatrics

## 2021-03-05 DIAGNOSIS — F411 Generalized anxiety disorder: Secondary | ICD-10-CM

## 2021-03-05 DIAGNOSIS — E282 Polycystic ovarian syndrome: Secondary | ICD-10-CM

## 2021-03-05 MED ORDER — NORELGESTROMIN-ETH ESTRADIOL 150-35 MCG/24HR TD PTWK: 1.0000 | MEDICATED_PATCH | TRANSDERMAL | 3 refills | Status: DC

## 2021-03-05 MED ORDER — PROMETHAZINE HCL 12.5 MG PO TABS: ORAL_TABLET | ORAL | 0 refills | Status: AC

## 2021-03-05 MED ORDER — SERTRALINE HCL 50 MG PO TABS: 50.0000 mg | ORAL_TABLET | Freq: Every day | ORAL | 1 refills | Status: DC

## 2021-04-19 ENCOUNTER — Other Ambulatory Visit: Payer: Self-pay | Admitting: Pediatrics

## 2021-04-19 DIAGNOSIS — E282 Polycystic ovarian syndrome: Secondary | ICD-10-CM

## 2021-04-19 MED ORDER — NORELGESTROMIN-ETH ESTRADIOL 150-35 MCG/24HR TD PTWK: 1.0000 | MEDICATED_PATCH | TRANSDERMAL | 3 refills | Status: DC

## 2021-11-10 IMAGING — NM NM GASTRIC EMPTYING
3 series · 3 of 3 positions shown · non-contrast
Comparison: None.

CLINICAL DATA: Abdominal pain and bloating.  Nausea and vomiting.

EXAM:
NUCLEAR MEDICINE GASTRIC EMPTYING SCAN
TECHNIQUE: After oral ingestion of radiolabeled meal, sequential abdominal
images were obtained for 4 hours. Percentage of activity emptying
the stomach was calculated at 1 hour, 2 hour, 3 hour, and 4 hours.
RADIOPHARMACEUTICALS:  1.9 mCi Gc-EEm sulfur colloid in standardized
meal

[Series 2: 1 hr · 4.14mm/px · 1 of 1 slices shown]
[im 1/1]
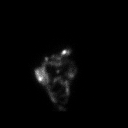

[Series 3: 2 hr · 4.14mm/px · 1 of 1 slices shown]
[im 1/1]
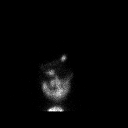

[Series 4: 90 min · 4.14mm/px · 1 of 1 slices shown]
[im 1/1]
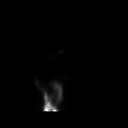

[3 of 3 positions shown; findings below may reference images not displayed]

FINDINGS: Expected location of the stomach in the left upper quadrant.
Ingested meal empties the stomach gradually over the course of the
study.

70% emptied at 1 hr ( normal >= 10%)

96% emptied at 2 hr ( normal >= 40%)

100% emptied at 3 hr ( normal >= 70%)

100% emptied at 4 hr ( normal >= 90%)
IMPRESSION: Normal gastric emptying study.

## 2021-12-19 ENCOUNTER — Other Ambulatory Visit: Payer: Self-pay | Admitting: Pediatrics

## 2021-12-19 MED ORDER — MONTELUKAST SODIUM 10 MG PO TABS
10.0000 mg | ORAL_TABLET | Freq: Every day | ORAL | 3 refills | Status: AC
Start: 2021-12-19 — End: ?

## 2021-12-20 ENCOUNTER — Other Ambulatory Visit: Payer: Self-pay

## 2021-12-20 ENCOUNTER — Emergency Department (HOSPITAL_COMMUNITY): Payer: Medicaid Other

## 2021-12-20 ENCOUNTER — Encounter (HOSPITAL_COMMUNITY): Payer: Self-pay

## 2021-12-20 ENCOUNTER — Emergency Department (HOSPITAL_COMMUNITY)
Admission: EM | Admit: 2021-12-20 | Discharge: 2021-12-20 | Disposition: A | Discharge status: Home / Self Care | Payer: Medicaid Other | Attending: Emergency Medicine | Admitting: Emergency Medicine

## 2021-12-20 DIAGNOSIS — R0600 Dyspnea, unspecified: Secondary | ICD-10-CM | POA: Diagnosis not present

## 2021-12-20 DIAGNOSIS — T7840XA Allergy, unspecified, initial encounter: Secondary | ICD-10-CM | POA: Insufficient documentation

## 2021-12-20 DIAGNOSIS — Z79899 Other long term (current) drug therapy: Secondary | ICD-10-CM | POA: Diagnosis not present

## 2021-12-20 DIAGNOSIS — E039 Hypothyroidism, unspecified: Secondary | ICD-10-CM | POA: Diagnosis not present

## 2021-12-20 DIAGNOSIS — Z7952 Long term (current) use of systemic steroids: Secondary | ICD-10-CM | POA: Insufficient documentation

## 2021-12-20 DIAGNOSIS — J45909 Unspecified asthma, uncomplicated: Secondary | ICD-10-CM | POA: Diagnosis not present

## 2021-12-20 DIAGNOSIS — R0602 Shortness of breath: Secondary | ICD-10-CM | POA: Diagnosis present

## 2021-12-20 LAB — CBC WITH DIFFERENTIAL/PLATELET
Abs Immature Granulocytes: 0.03 10*3/uL (ref 0.00–0.07)
Basophils Absolute: 0 10*3/uL (ref 0.0–0.1)
Basophils Relative: 0 %
Eosinophils Absolute: 0.4 10*3/uL (ref 0.0–0.5)
Eosinophils Relative: 4 %
HCT: 40.8 % (ref 36.0–46.0)
Hemoglobin: 13.4 g/dL (ref 12.0–15.0)
Immature Granulocytes: 0 %
Lymphocytes Relative: 28 %
Lymphs Abs: 2.6 10*3/uL (ref 0.7–4.0)
MCH: 30.9 pg (ref 26.0–34.0)
MCHC: 32.8 g/dL (ref 30.0–36.0)
MCV: 94 fL (ref 80.0–100.0)
Monocytes Absolute: 0.9 10*3/uL (ref 0.1–1.0)
Monocytes Relative: 10 %
Neutro Abs: 5.3 10*3/uL (ref 1.7–7.7)
Neutrophils Relative %: 58 %
Platelets: 240 10*3/uL (ref 150–400)
RBC: 4.34 MIL/uL (ref 3.87–5.11)
RDW: 12.6 % (ref 11.5–15.5)
WBC: 9.2 10*3/uL (ref 4.0–10.5)
nRBC: 0 % (ref 0.0–0.2)

## 2021-12-20 LAB — BASIC METABOLIC PANEL
Anion gap: 6 (ref 5–15)
BUN: 11 mg/dL (ref 6–20)
CO2: 24 mmol/L (ref 22–32)
Calcium: 9.2 mg/dL (ref 8.9–10.3)
Chloride: 108 mmol/L (ref 98–111)
Creatinine, Ser: 0.61 mg/dL (ref 0.44–1.00)
GFR, Estimated: >60 mL/min (ref >60)
Glucose, Bld: 84 mg/dL (ref 70–99)
Potassium: 4 mmol/L (ref 3.5–5.1)
Sodium: 138 mmol/L (ref 135–145)

## 2021-12-20 LAB — D-DIMER, QUANTITATIVE: D-Dimer, Quant: <0.27 ug/mL-FEU (ref 0.00–0.50)

## 2021-12-20 MED ORDER — PREDNISONE 50 MG PO TABS: 50.0000 mg | ORAL_TABLET | Freq: Every day | ORAL | 0 refills | Status: DC

## 2021-12-20 MED ORDER — PREDNISONE 20 MG PO TABS
60.0000 mg | ORAL_TABLET | Freq: Once | ORAL | Status: AC
  Administered 2021-12-20: 60 mg via ORAL
  Filled 2021-12-20: qty 3

## 2021-12-20 NOTE — Discharge Instructions (Signed)
Take the steroids as prescribed to see if it helps with your symptoms.  Follow-up with your primary care doctor to be rechecked.

## 2021-12-20 NOTE — ED Triage Notes (Signed)
Patient just recently started propranolol and mestanone for POTS and [facility].  Patient now has difficultry getting a deep breath and tightness in her chest.  Hx of asthma when younger.  Patient also takes hormonal birth control and mother has hx of stroke while pregnant Patient also complains of rash all over.

## 2021-12-20 NOTE — ED Provider Triage Note (Addendum)
Emergency Medicine Provider Triage Evaluation Note  Breanna Gaines , a 21 y.o. female  was evaluated in triage.  Pt complains of shortness of breath, rash.  Patient reports that 2 weeks ago she started taking propanolol as well as mestanone.  Patient reports that around time she began these medications, she developed a rash on her right shoulder, right leg.  Patient also states that she has had increased chest tightness, inability to take a deep breath.  Patient reports that she wears hormonal birth control patch.  Patient recently admitted, worked up for [employer] as well as POTS.  Patient also endorsing leg swelling.  Denies fevers, nausea, vomiting, diarrhea, dizziness.  Review of Systems  Positive:  Negative:   Physical Exam  BP 124/86 (BP Location: Right Arm)   Pulse 69   Temp 98.2 F (36.8 C) (Oral)   Resp 16   Ht 5\' 5"  (1.651 m)   Wt 56.7 kg   SpO2 99%   BMI 20.80 kg/m  Gen:   Awake, no distress   Resp:  Normal effort  MSK:   Moves extremities without difficulty  Other:  Lung sounds good auscultation bilaterally.  Reproducible chest pain midsternal.  No appreciable lower extremity swelling.  Medical Decision Making  Medically screening exam initiated at 7:09 PM.  Appropriate orders placed.  Isabella Roemmich Segundo was informed that the remainder of the evaluation will be completed by another provider, this initial triage assessment does not replace that evaluation, and the importance of remaining in the ED until their evaluation is complete.         [name], PA-C 12/20/21 1912

## 2021-12-20 NOTE — ED Notes (Signed)
Pt given [place] sandwich per Tomi Bamberger MD

## 2021-12-20 NOTE — ED Provider Notes (Signed)
[facility] EMERGENCY DEPARTMENT Provider Note   CSN: [id number] Arrival date & time: 12/20/21  1832     History  Chief Complaint  Patient presents with   Shortness of Breath    Breanna Gaines is a 21 y.o. female.   Shortness of Breath Associated symptoms: no fever    Patient has history of goiter, hypothyroidism, ADD, Hashimoto's thyroiditis, asthma, IBS, POTS syndrome, asthma.  Patient states she has been having trouble with feeling short of breath for the last several days.  Patient has been trying her albuterol inhaler but does not feel like it is helping.  She does not feel like she can take a deep breath.  She has not had any leg swelling.  No chest pain.  Patient did start new medications recently.  She is not sure if that could be related.  After starting those medications she did develop a rash.  Patient called her doctor was concerned about the possibility of a blood clot so she was sent to the ED for evaluation  Home Medications Prior to Admission medications   Medication Sig Start Date End Date Taking? Authorizing Provider  clindamycin (CLEOCIN-T) 1 % external solution Apply to affected area 2 times daily 02/05/21 02/05/22  Alfonso Ramus T, FNP  predniSONE (DELTASONE) 50 MG tablet Take 1 tablet (50 mg total) by mouth daily. 12/20/21  Yes Linwood Dibbles, MD  AMITIZA 24 MCG capsule Take 1 capsule (24 mcg total) by mouth 2 (two) times daily with a meal. 10/30/20   Alfonso Ramus T, FNP  Amphetamine Sulfate 10 MG TABS TAKE 1/2 - 1 TABLETS BY MOUTH TWICE A DAY 10/26/20   Verneda Skill, FNP  Digestive Enzymes (ENZYME DIGEST) CAPS Take by mouth.    [provider]  ibuprofen (ADVIL) 400 MG tablet Take 400 mg by mouth every 6 (six) hours as needed.    [provider]  mirtazapine (REMERON) 30 MG tablet Take by mouth. 11/09/20   [provider]  montelukast (SINGULAIR) 10 MG tablet Take 1 tablet (10 mg total) by mouth at bedtime.  12/19/21   Verneda Skill, FNP  MOTEGRITY 2 MG TABS Take 1 tablet by mouth daily as needed. 11/20/20   [provider]  norelgestromin-ethinyl estradiol (ORTHO EVRA) 150-35 MCG/24HR transdermal patch Place 1 patch onto the skin once a week. Place 1 patch onto the skin once weekly. Do not skip a week, wear patch weekly for continuous cycling 04/19/21   Alfonso Ramus T, FNP  Omeprazole (PRILOSEC PO) Take 1 tablet by mouth daily.    [provider]  ondansetron (ZOFRAN) 4 MG tablet Take 1 tab by mouth every 6 hours as needed for nausea and vomiting. 07/29/19   Iva Boop, MD  promethazine (PHENERGAN) 12.5 MG tablet TAKE ONE-TWO TABS EVERY 6 -8 HOURS AS NEEDED FOR NAUSEA 03/05/21   Verneda Skill, FNP  promethazine (PHENERGAN) 25 MG suppository Place 1 suppository (25 mg total) rectally every 12 (twelve) hours as needed for nausea or vomiting. 07/24/20   Iva Boop, MD  sertraline (ZOLOFT) 50 MG tablet Take 1 tablet (50 mg total) by mouth daily. 03/05/21   Verneda Skill, FNP  sodium chloride 1 g tablet Take 1 tablet (1 g total) by mouth 3 (three) times daily. 12/14/20   Verneda Skill, FNP      Allergies    Coconut flavor, Dairy aid [tilactase], Gluten meal, and Penicillins    Review of Systems  Review of Systems  Constitutional:  Negative for fever.  Respiratory:  Positive for shortness of breath.    Physical Exam Updated Vital Signs BP 116/84   Pulse 74   Temp 98.3 F (36.8 C) (Oral)   Resp 18   Ht 1.651 m (5\' 5" )   Wt 56.7 kg   SpO2 99%   BMI 20.80 kg/m  Physical Exam Vitals and nursing note reviewed.  Constitutional:      General: She is not in acute distress.    Appearance: She is well-developed.  HENT:     Head: Normocephalic and atraumatic.     Right Ear: External ear normal.     Left Ear: External ear normal.  Eyes:     General: No scleral icterus.       Right eye: No discharge.        Left eye: No discharge.      Conjunctiva/sclera: Conjunctivae normal.  Neck:     Trachea: No tracheal deviation.  Cardiovascular:     Rate and Rhythm: Normal rate and regular rhythm.  Pulmonary:     Effort: Pulmonary effort is normal. No respiratory distress.     Breath sounds: Normal breath sounds. No stridor. No wheezing or rales.  Abdominal:     General: Bowel sounds are normal. There is no distension.     Palpations: Abdomen is soft.     Tenderness: There is no abdominal tenderness. There is no guarding or rebound.  Musculoskeletal:        General: No tenderness or deformity.     Cervical back: Neck supple.  Skin:    General: Skin is warm and dry.     Findings: No rash.  Neurological:     General: No focal deficit present.     Mental Status: She is alert.     Cranial Nerves: No cranial nerve deficit (no facial droop, extraocular movements intact, no slurred speech).     Sensory: No sensory deficit.     Motor: No abnormal muscle tone or seizure activity.     Coordination: Coordination normal.  Psychiatric:        Mood and Affect: Mood normal.    ED Results / Procedures / Treatments   Labs (all labs ordered are listed, but only abnormal results are displayed) Labs Reviewed  BASIC METABOLIC PANEL  CBC WITH DIFFERENTIAL/PLATELET  D-DIMER, QUANTITATIVE    EKG EKG Interpretation  Date/Time:  Thursday December 20 2021 18:44:15 EDT Ventricular Rate:  71 PR Interval:  126 QRS Duration: 76 QT Interval:  378 QTC Calculation: 410 R Axis:   82 Text Interpretation: Normal sinus rhythm Normal ECG When compared with ECG of 10-Nov-2020 09:18, No significant change since last tracing Confirmed by 12-Nov-2020 [phone]) on 12/20/2021 8:41:24 PM  Radiology DG Chest 2 View  Result Date: 12/20/2021 CLINICAL DATA:  Shortness of breath and chest tightness EXAM: CHEST - 2 VIEW COMPARISON:  None FINDINGS: Heart size is normal. Mediastinal shadows are normal. The lungs are clear. No bronchial thickening. No infiltrate, mass,  effusion or collapse. Pulmonary vascularity is normal. No bony abnormality. IMPRESSION: Normal chest Electronically Signed   By: 02/19/2022 M.D.   On: 12/20/2021 19:46    Procedures Procedures    Medications Ordered in ED Medications  predniSONE (DELTASONE) tablet 60 mg (has no administration in time range)    ED Course/ Medical Decision Making/ A&P Clinical Course as of 12/20/21 2313  Thu Dec 20, 2021  2040 DG Chest 2 View  Chest x-ray images and radiology report reviewed.  No acute findings [JK]  2109 Basic metabolic panel Normal [JK]  2109 CBC with Differential Normal [JK]  2254 D-dimer, quantitative D-dimer is normal [JK]    Clinical Course User Index [JK] Linwood DibblesKnapp, Hadrian Yarbrough, MD                           Medical Decision Making Problems Addressed: Allergy, initial encounter: acute illness or injury Dyspnea, unspecified type: acute illness or injury  Amount and/or Complexity of Data Reviewed Labs: ordered. Decision-making details documented in ED Course. Radiology: ordered and independent interpretation performed. Decision-making details documented in ED Course.  Risk Prescription drug management.   Patient presented to ED with complaints of shortness of breath.  No signs of pneumonia or pneumothorax on chest x-ray.  Normal rhythm.  Lungs are clear no bronchospasm noted on my exam.  D-dimer is negative.  Doubt PE.  Patient has not toxic or tachypneic.  Patient does have a history of allergies.  Is possible she could be having an asthma type component to some of her symptoms.  Discussed trial of steroids.  Patient and mom are comfortable with that plan.Evaluation and diagnostic testing in the emergency department does not suggest an emergent condition requiring admission or immediate intervention beyond what has been performed at this time.  The patient is safe for discharge and has been instructed to return immediately for worsening symptoms, change in symptoms or any other  concerns.         Final Clinical Impression(s) / ED Diagnoses Final diagnoses:  Dyspnea, unspecified type  Allergy, initial encounter    Rx / DC Orders ED Discharge Orders          Ordered    predniSONE (DELTASONE) 50 MG tablet  Daily        12/20/21 2313              Linwood DibblesKnapp, Macintyre Alexa, MD 12/20/21 2315

## 2021-12-24 ENCOUNTER — Ambulatory Visit: Payer: Medicaid Other | Admitting: Pediatrics

## 2021-12-24 VITALS — BP 119/77 | HR 92 | Ht 65.16 in | Wt 124.4 lb

## 2021-12-24 DIAGNOSIS — Q796 Ehlers-Danlos syndrome, unspecified: Secondary | ICD-10-CM | POA: Diagnosis not present

## 2021-12-24 DIAGNOSIS — F411 Generalized anxiety disorder: Secondary | ICD-10-CM

## 2021-12-24 DIAGNOSIS — E282 Polycystic ovarian syndrome: Secondary | ICD-10-CM

## 2021-12-24 DIAGNOSIS — J4520 Mild intermittent asthma, uncomplicated: Secondary | ICD-10-CM

## 2021-12-24 DIAGNOSIS — G90A Postural orthostatic tachycardia syndrome (POTS): Secondary | ICD-10-CM

## 2021-12-24 DIAGNOSIS — F5082 Avoidant/restrictive food intake disorder: Secondary | ICD-10-CM | POA: Diagnosis not present

## 2021-12-24 MED ORDER — ALBUTEROL SULFATE HFA 108 (90 BASE) MCG/ACT IN AERS: 2.0000 | INHALATION_SPRAY | Freq: Four times a day (QID) | RESPIRATORY_TRACT | 2 refills | Status: AC | PRN

## 2021-12-24 MED ORDER — SPIRONOLACTONE 50 MG PO TABS: 25.0000 mg | ORAL_TABLET | Freq: Every day | ORAL | 1 refills | Status: AC

## 2021-12-24 MED ORDER — SODIUM CHLORIDE 1 G PO TABS: 1.0000 g | ORAL_TABLET | Freq: Three times a day (TID) | ORAL | 3 refills | Status: AC

## 2021-12-24 MED ORDER — SERTRALINE HCL 50 MG PO TABS: 50.0000 mg | ORAL_TABLET | Freq: Every day | ORAL | 1 refills | Status: AC

## 2021-12-24 NOTE — Patient Instructions (Addendum)
[facility]- Dr. Darrick Penna [address] [place], [place], [place] [address] Phone: [phone]  Physical therapy at [facility]  [address], [place], [place] [address] Phone: [phone]  Take patch off after beach trip and we will eval your hormones  Start spironolactone 25 mg (1/2 tablet) once daily in the AM. Increase to 1 tablet daily if well tolerated  Continue steroids and singulair  Talk to cardiology this week about propranolol

## 2021-12-24 NOTE — Progress Notes (Signed)
History was provided by the patient.  Breanna Gaines is a 21 y.o. female who is here for POTS, EDS, PCOS, GAD.  Breanna Bexlee Bergdoll, MD   HPI: has been formally dx with POTS and EDS. Followed by cardiology at [facility].   Was seen in ED for concern for PE r/t hormonal contraception. Mom did have a suspected TIA in pregnancy, though was never confirmed, but was told not to take hormonal contraception in the past. Was given steroid burst to try for SOB and singular was sent again as she was on this in the past. She is on propranolol and mestinon. Wonders if the propranolol is worsening her asthma and fatigue.   Stomach has been better than in the past. She is not vomiting anymore but does still require medications to poop. She is eating solids normally. Salt and water increase has been really good.   Feels like change to the patch really helped stop her GI symptoms to some degree too. Continues with some hair growth and folliculitis that comes on her legs. Thinking about starting spironolactone again. Taking patch continuously so is not having menstrual cycles. Interested in getting hormone levels checked.   Having more ongoing pain in her legs- equates it to feeling like "i've been hit with a meat tenderizer." Interested in physical therapy and [facility] in particular. Open to sports med referral to Dr. Darrick Penna.   No LMP recorded.  ROS  Patient Active Problem List   Diagnosis Date Noted   PCOS (polycystic ovarian syndrome) 10/26/2020   Chronic constipation 07/26/2020   Generalized abdominal pain 07/26/2020   Nausea and vomiting in adult 12/17/2018   Avoidant-restrictive food intake disorder (ARFID) ??? 11/05/2018   Food intolerance 12/12/2017   Hirsutism 12/12/2017   Acne 12/11/2017   IBS (irritable bowel syndrome) 11/06/2017   Folliculitis 06/26/2015   Oligomenorrhea 05/03/2014   Chronic tension type headache 12/28/2013   Migraine without aura, without mention of intractable migraine  without mention of status migrainosus 12/28/2013   Panic disorder 12/28/2013   Thyroiditis, autoimmune    Goiter, unspecified 01/07/2011    Current Outpatient Medications on File Prior to Visit  Medication Sig Dispense Refill   Amphetamine Sulfate 10 MG TABS TAKE 1/2 - 1 TABLETS BY MOUTH TWICE A DAY 60 tablet 0   cefdinir (OMNICEF) 300 MG capsule cefdinir Take 1 capsule (oral) every 12 hours for 10 days [id number] capsule every 12 hours oral 10 days active 300 MG     clindamycin (CLEOCIN-T) 1 % external solution Apply to affected area 2 times daily 60 mL 1   montelukast (SINGULAIR) 10 MG tablet Take 1 tablet (10 mg total) by mouth at bedtime. 30 tablet 3   norelgestromin-ethinyl estradiol (ORTHO EVRA) 150-35 MCG/24HR transdermal patch Place 1 patch onto the skin once a week. Place 1 patch onto the skin once weekly. Do not skip a week, wear patch weekly for continuous cycling 16 patch 3   Omeprazole (PRILOSEC PO) Take 1 tablet by mouth daily.     ondansetron (ZOFRAN) 4 MG tablet Take 1 tab by mouth every 6 hours as needed for nausea and vomiting. 100 tablet 0   predniSONE (DELTASONE) 50 MG tablet Take 1 tablet (50 mg total) by mouth daily. 5 tablet 0   promethazine (PHENERGAN) 12.5 MG tablet TAKE ONE-TWO TABS EVERY 6 -8 HOURS AS NEEDED FOR NAUSEA 45 tablet 0   promethazine (PHENERGAN) 25 MG suppository Place 1 suppository (25 mg total) rectally every 12 (twelve) hours as  needed for nausea or vomiting. 12 each 0   sertraline (ZOLOFT) 50 MG tablet Take 1 tablet (50 mg total) by mouth daily. 90 tablet 1   sodium chloride 1 g tablet Take 1 tablet (1 g total) by mouth 3 (three) times daily. 90 tablet 3   AMITIZA 24 MCG capsule Take 1 capsule (24 mcg total) by mouth 2 (two) times daily with a meal. (Patient not taking: Reported on 12/24/2021) 60 capsule 1   Digestive Enzymes (ENZYME DIGEST) CAPS Take by mouth. (Patient not taking: Reported on 12/24/2021)     ibuprofen (ADVIL) 400 MG tablet Take 400 mg by  mouth every 6 (six) hours as needed. (Patient not taking: Reported on 12/24/2021)     mirtazapine (REMERON) 30 MG tablet Take by mouth. (Patient not taking: Reported on 12/24/2021)     MOTEGRITY 2 MG TABS Take 1 tablet by mouth daily as needed. (Patient not taking: Reported on 12/24/2021)     propranolol (INDERAL) 10 MG tablet Take by mouth.     pyridostigmine (MESTINON) 60 MG tablet Take 60 mg by mouth daily.     No current facility-administered medications on file prior to visit.    Allergies  Allergen Reactions   Coconut Flavor    Dairy Aid [Tilactase]    Gluten Meal Nausea Only   Penicillins Rash    Social History: Confidentiality was discussed with the patient and if applicable, with caregiver as well. Tobacco: no Secondhand smoke exposure? no Drugs/EtOH: occ etoh Sexually active? No, last time last year with female partner  Safety: safe at home/school  Last STI Screening: due now  Pregnancy Prevention: patch   Physical Exam:    Vitals:   12/24/21 0905  BP: 119/77  Pulse: 92  Weight: 124 lb 6 oz (56.4 kg)  Height: 5' 5.16" (1.655 m)    Growth percentile SmartLinks can only be used for patients less than 21 years old.  Physical Exam Vitals and nursing note reviewed.  Constitutional:      General: She is not in acute distress.    Appearance: She is well-developed.  Neck:     Thyroid: No thyromegaly.  Cardiovascular:     Rate and Rhythm: Normal rate and regular rhythm.     Heart sounds: No murmur heard. Pulmonary:     Breath sounds: Normal breath sounds.  Abdominal:     Palpations: Abdomen is soft. There is no mass.     Tenderness: There is no abdominal tenderness. There is no guarding.  Musculoskeletal:     Right lower leg: No edema.     Left lower leg: No edema.  Lymphadenopathy:     Cervical: No cervical adenopathy.  Skin:    General: Skin is warm.     Findings: No rash.  Neurological:     Mental Status: She is alert.     Comments: No tremor  Psychiatric:         Mood and Affect: Mood and affect normal.    Assessment/Plan: 1. PCOS (polycystic ovarian syndrome) Discussed checking hormones. No utility while on the patch, but she is open to stopping x 1 month and reassessing. She is also interested in restarting spiro. We will do this slowly given POTS and see how she tolerates. Ultimately discussed that laser hair removal may be beneficial on legs given how dark and thick hair is which would also help prevent folliculitis in the future. She was interested in considering.  - spironolactone (ALDACTONE) 50 MG tablet; Take 0.5  tablets (25 mg total) by mouth daily.  Dispense: 90 tablet; Refill: 1  2. POTS (postural orthostatic tachycardia syndrome) Continue care with cardiology. Overall improving. Will benefit from PT for overall conditioning to improve symptoms.  - sodium chloride 1 g tablet; Take 1 tablet (1 g total) by mouth 3 (three) times daily.  Dispense: 90 tablet; Refill: 3 - Ambulatory referral to Physical Therapy - Ambulatory referral to Sports Medicine  3. EDS (Ehlers-Danlos syndrome) Will benefit from [facility]. Also open to seeing Dr. Darrick Penna to talk about any other options for MSK pain control.  - Ambulatory referral to Physical Therapy - Ambulatory referral to Sports Medicine  4. Avoidant-restrictive food intake disorder (ARFID)  Overall resolved and r/t her POTS symptoms.   5. GAD (generalized anxiety disorder) Continue sertraline 50 mg daily. Feels overall well controlled.  - sertraline (ZOLOFT) 50 MG tablet; Take 1 tablet (50 mg total) by mouth daily.  Dispense: 90 tablet; Refill: 1  6. Mild intermittent asthma without complication Refill albuterol today. Continue prednisone as prescribed and singulair. Advised to call cardiology ASAP and discuss alternative to propranolol and BB seems to have exacerbated symptoms.  - albuterol (VENTOLIN HFA) 108 (90 Base) MCG/ACT inhaler; Inhale 2 puffs into the lungs every 6 (six) hours as needed  for wheezing or shortness of breath.  Dispense: 8 g; Refill: 2  Return in 8 weeks for labs   Alfonso Ramus, FNP  I spent >35 minutes spent face to face with patient with more than 50% of appointment spent discussing diagnosis, management, follow-up, and reviewing of GAD, POTS, EDS, PCOS, therapy recommendations. I spent an additional 10 minutes on pre-and post-visit activities.

## 2022-01-01 ENCOUNTER — Ambulatory Visit: Payer: Medicaid Other | Admitting: Family Medicine

## 2022-01-01 ENCOUNTER — Encounter: Payer: Self-pay | Admitting: Family Medicine

## 2022-01-01 VITALS — BP 110/72 | Ht 65.0 in | Wt 124.0 lb

## 2022-01-01 DIAGNOSIS — M25531 Pain in right wrist: Secondary | ICD-10-CM | POA: Diagnosis not present

## 2022-01-01 DIAGNOSIS — Q7962 Hypermobile Ehlers-Danlos syndrome: Secondary | ICD-10-CM | POA: Diagnosis present

## 2022-01-01 DIAGNOSIS — M25532 Pain in left wrist: Secondary | ICD-10-CM | POA: Diagnosis not present

## 2022-01-01 DIAGNOSIS — R5382 Chronic fatigue, unspecified: Secondary | ICD-10-CM

## 2022-01-01 NOTE — Assessment & Plan Note (Signed)
Beighton score 9/9.  Has a diagnosis of pots disease and multiple joint pain with fatigue. -Counseled on home exercise therapy and supportive care. -Referral to physical therapy.

## 2022-01-01 NOTE — Patient Instructions (Signed)
Nice to meet you Please check out Breanna Gaines  Please check out the body braid  I have made a referral to physical therapy  I will call with the lab results.   Please send me a message in MyChart with any questions or updates.  Please see me back in 4-6 weeks.   --Dr. Jordan Likes

## 2022-01-01 NOTE — Assessment & Plan Note (Signed)
Acute on chronic in nature.  Could be associated with hypermobility but will also check her ferritin. -Iron and ferritin.

## 2022-01-01 NOTE — Progress Notes (Signed)
  Breanna Gaines - 21 y.o. female MRN [id number]  Date of birth: 15-Apr-2001  SUBJECTIVE:  Including CC & ROS.  No chief complaint on file.   Breanna Gaines is a 21 y.o. female that is presenting with chronic arthralgias, fatigue and hypermobility.  She was recently diagnosed with pots disease at [facility].  She has a long history of low back pain and peripheral joint pains.  No history of surgery.  Review of the note from 5/8 shows a tilt table test positive for postural orthostatic tachycardic syndrome. Review of the emergency department note from 6/9 shows she was provided prednisone. Independent review of the chest x-ray from 6/1 shows no acute changes.  Review of Systems See HPI   HISTORY: Past Medical, Surgical, Social, and Family History Reviewed & Updated per EMR.   Pertinent Historical Findings include:  Past Medical History:  Diagnosis Date   ADD (attention deficit disorder)    Anxiety    while undergoing anesthesia with mask   Goiter    Hashimoto's thyroiditis    Headache(784.0)    History of asthma    has not required meds. in years   Hypothyroidism    IBS (irritable bowel syndrome) 11/06/2017   Isosexual precocity    Post-operative nausea and vomiting    Precocious puberty 01/07/2011    Past Surgical History:  Procedure Laterality Date   GIVENS CAPSULE STUDY N/A 03/04/2019   Procedure: GIVENS CAPSULE STUDY;  Surgeon: Gatha Mayer, MD;  Location: [facility] ENDOSCOPY;  Service: Endoscopy;  Laterality: N/A;   MASS EXCISION Right 02/22/2013   Procedure: right arm implant removal  ;  Surgeon: Rolm Bookbinder, MD;  Location: [place];  Service: General;  Laterality: Right;   MULTIPLE TOOTH EXTRACTIONS     SUPPRELIN IMPLANT  02/12/2010   TONSILLECTOMY AND ADENOIDECTOMY  [id number]   UMBILICAL HERNIA REPAIR     URACHAL CYST EXCISION  01/06/2001     PHYSICAL EXAM:  VS: BP 110/72 (BP Location: Left Arm, Patient Position: Sitting)   Ht 5\' 5"  (1.651 m)   Wt  124 lb (56.2 kg)   BMI 20.63 kg/m  Physical Exam Gen: NAD, alert, cooperative with exam, well-appearing MSK:  Neurovascularly intact       ASSESSMENT & PLAN:   Ehlers-Danlos syndrome type III Beighton score 9/9.  Has a diagnosis of pots disease and multiple joint pain with fatigue. -Counseled on home exercise therapy and supportive care. -Referral to physical therapy.   Chronic fatigue Acute on chronic in nature.  Could be associated with hypermobility but will also check her ferritin. -Iron and ferritin.  Pain of both wrist joints Acute on chronic in nature.  May be associated with just her hypermobility. -ANA panel.

## 2022-01-01 NOTE — Assessment & Plan Note (Signed)
Acute on chronic in nature.  May be associated with just her hypermobility. -ANA panel.

## 2022-01-04 LAB — IRON,TIBC AND FERRITIN PANEL
Ferritin: 54 ng/mL (ref 15–150)
Iron Saturation: 46 % (ref 15–55)
Iron: 199 ug/dL — ABNORMAL HIGH (ref 27–159)
Total Iron Binding Capacity: 435 ug/dL (ref 250–450)
UIBC: 236 ug/dL (ref 131–425)

## 2022-01-04 LAB — ANA,IFA RA DIAG PNL W/RFLX TIT/PATN
ANA Titer 1: NEGATIVE
Cyclic Citrullin Peptide Ab: 4 units (ref 0–19)
Rheumatoid fact SerPl-aCnc: <10 IU/mL (ref <14.0)

## 2022-01-07 ENCOUNTER — Telehealth: Payer: Self-pay | Admitting: Family Medicine

## 2022-01-07 NOTE — Telephone Encounter (Signed)
Left VM for patient. If she calls back please have her speak with a nurse/CMA and inform that her ferritin and autoimmune blood work is normal.  We will continue with physical therapy and current regimen..   If any questions then please take the best time and phone number to call and I will try to call her back.   Myra Rude, MD [facility] 01/07/2022, 9:19 AM

## 2022-01-31 ENCOUNTER — Ambulatory Visit: Payer: Medicaid Other | Admitting: Family Medicine

## 2022-02-14 ENCOUNTER — Encounter: Payer: Self-pay | Admitting: Pediatrics

## 2022-02-14 ENCOUNTER — Ambulatory Visit: Payer: Medicaid Other | Admitting: Pediatrics

## 2022-02-14 VITALS — BP 105/70 | HR 71 | Ht 65.0 in | Wt 123.4 lb

## 2022-02-14 DIAGNOSIS — F900 Attention-deficit hyperactivity disorder, predominantly inattentive type: Secondary | ICD-10-CM | POA: Diagnosis not present

## 2022-02-14 DIAGNOSIS — G90A Postural orthostatic tachycardia syndrome (POTS): Secondary | ICD-10-CM | POA: Insufficient documentation

## 2022-02-14 DIAGNOSIS — R5382 Chronic fatigue, unspecified: Secondary | ICD-10-CM

## 2022-02-14 DIAGNOSIS — E282 Polycystic ovarian syndrome: Secondary | ICD-10-CM

## 2022-02-14 DIAGNOSIS — Z113 Encounter for screening for infections with a predominantly sexual mode of transmission: Secondary | ICD-10-CM

## 2022-02-14 DIAGNOSIS — Q7962 Hypermobile Ehlers-Danlos syndrome: Secondary | ICD-10-CM

## 2022-02-14 DIAGNOSIS — Z3202 Encounter for pregnancy test, result negative: Secondary | ICD-10-CM

## 2022-02-14 DIAGNOSIS — F411 Generalized anxiety disorder: Secondary | ICD-10-CM

## 2022-02-14 MED ORDER — METHYLPHENIDATE HCL 10 MG PO TABS: 10.0000 mg | ORAL_TABLET | Freq: Two times a day (BID) | ORAL | 0 refills | Status: AC

## 2022-02-14 NOTE — Patient Instructions (Signed)
Labs today- we will check hormones and blood sugar labs  Get established with physical therapy in [place]- mychart doc in [facility] about this  Call [facility] and get established with Dr. Belva Agee  Let your cardiology team know about your heart rate and send them some data from your watch  Try the methylphenidate. Start with 1/2-1 tablet twice daily and increase accordingly for ADHD and brain fog symptoms. Watch for changes in heart rate with it as well.

## 2022-02-14 NOTE — Addendum Note (Signed)
Addended by: Ardeth Sportsman on: 02/14/2022 02:56 PM   Modules accepted: Orders

## 2022-02-14 NOTE — Progress Notes (Signed)
History was provided by the patient.  Breanna Gaines is a 21 y.o. female who is here for EDS, POTS, ADHD, anxiety, PCOS.  Aggie Dathan Attia, MD   HPI:  Pt reports that medically things have not been great. She is exhausted all the time. She is sleeping well. It seems to come in waves but can't get through the day without a nap and lots of coffee. Also with some chronic pain. Restarted ADHD medication 10 days ago which helped some. She says she hasn't been able to exercise because she is so tired and with pain although she knows she should. Has referrals for PT but she is going to back to school so hasn't started.   Stopped OCP and has had two normal periods since then. Has had some weight fluctuations up and down since stopping. Mom and aunts are on metformin so was wondering if she needs to be taking that at all. Taking spironolactone.   Has been having higher HR spikes at times to the 130-150s at rest but has not reached out to her cardiology team yet.   Still some significant bloating with GI stuff.   No LMP recorded.  ROS  Patient Active Problem List   Diagnosis Date Noted   POTS (postural orthostatic tachycardia syndrome) 02/14/2022   Ehlers-Danlos syndrome type III 01/01/2022   Pain of both wrist joints 01/01/2022   Chronic fatigue 01/01/2022   PCOS (polycystic ovarian syndrome) 10/26/2020   Chronic constipation 07/26/2020   Generalized abdominal pain 07/26/2020   Nausea and vomiting in adult 12/17/2018   Food intolerance 12/12/2017   Hirsutism 12/12/2017   Acne 12/11/2017   IBS (irritable bowel syndrome) 11/06/2017   Folliculitis 06/26/2015   Oligomenorrhea 05/03/2014   Chronic tension type headache 12/28/2013   Migraine without aura, without mention of intractable migraine without mention of status migrainosus 12/28/2013   Panic disorder 12/28/2013   Thyroiditis, autoimmune     Current Outpatient Medications on File Prior to Visit  Medication Sig Dispense Refill    albuterol (VENTOLIN HFA) 108 (90 Base) MCG/ACT inhaler Inhale 2 puffs into the lungs every 6 (six) hours as needed for wheezing or shortness of breath. 8 g 2   BYSTOLIC 2.5 MG tablet Take 2.5 mg by mouth daily.     montelukast (SINGULAIR) 10 MG tablet Take 1 tablet (10 mg total) by mouth at bedtime. 30 tablet 3   Omeprazole (PRILOSEC PO) Take 1 tablet by mouth daily.     ondansetron (ZOFRAN) 4 MG tablet Take 1 tab by mouth every 6 hours as needed for nausea and vomiting. 100 tablet 0   promethazine (PHENERGAN) 12.5 MG tablet TAKE ONE-TWO TABS EVERY 6 -8 HOURS AS NEEDED FOR NAUSEA 45 tablet 0   promethazine (PHENERGAN) 25 MG suppository Place 1 suppository (25 mg total) rectally every 12 (twelve) hours as needed for nausea or vomiting. 12 each 0   pyridostigmine (MESTINON) 60 MG tablet Take 60 mg by mouth daily.     sertraline (ZOLOFT) 50 MG tablet Take 1 tablet (50 mg total) by mouth daily. 90 tablet 1   sodium chloride 1 g tablet Take 1 tablet (1 g total) by mouth 3 (three) times daily. 90 tablet 3   spironolactone (ALDACTONE) 50 MG tablet Take 0.5 tablets (25 mg total) by mouth daily. 90 tablet 1   No current facility-administered medications on file prior to visit.    Allergies  Allergen Reactions   Coconut Flavor    Dairy Aid [Tilactase]  Gluten Meal Nausea Only   Penicillins Rash    Physical Exam:    Vitals:   02/14/22 1144  BP: 105/70  Pulse: 71  Weight: 123 lb 6.4 oz (56 kg)  Height: 5\' 5"  (1.651 m)    Growth %ile SmartLinks can only be used for patients less than 28 years old.  Physical Exam Vitals and nursing note reviewed.  Constitutional:      General: She is not in acute distress.    Appearance: She is well-developed.  Neck:     Thyroid: No thyromegaly.  Cardiovascular:     Rate and Rhythm: Normal rate and regular rhythm.     Heart sounds: No murmur heard. Pulmonary:     Breath sounds: Normal breath sounds.  Abdominal:     Palpations: Abdomen is soft.  There is no mass.     Tenderness: There is no abdominal tenderness. There is no guarding.  Musculoskeletal:     Right lower leg: No edema.     Left lower leg: No edema.  Lymphadenopathy:     Cervical: No cervical adenopathy.  Skin:    General: Skin is warm.     Capillary Refill: Capillary refill takes less than 2 seconds.     Findings: No rash.  Neurological:     Mental Status: She is alert.     Comments: No tremor  Psychiatric:        Mood and Affect: Mood and affect normal.     Assessment/Plan: 1. PCOS (polycystic ovarian syndrome) Has been off hormones for 2 months. Will repeat labs today to assess PCOS and ongoing plan as she has been sensitive to hormones and mom has hx of ? TIA when pregnant. Will refer to Dr. 26 at [facility] for ongoing management.  - DHEA-sulfate - Follicle stimulating hormone - Luteinizing hormone - Prolactin - Testos,Total,Free and SHBG (Female) - TSH + free T4 - Comprehensive metabolic panel - Hemoglobin A1c - Lipid panel - VITAMIN D 25 Hydroxy (Vit-D Deficiency, Fractures) - CBC with Differential/Platelet  2. Ehlers-Danlos syndrome type III Has seen sports med and will plan to get connected with PT in [place].   3. POTS (postural orthostatic tachycardia syndrome) Will trial MPH for ADHD and POTS since some data about brain fog improvement with MPH. Discussed can also increase HR so need to watch carefully and titrate slowly.  - methylphenidate (RITALIN) 10 MG tablet; Take 1-2 tablets (10-20 mg total) by mouth 2 (two) times daily with breakfast and lunch.  Dispense: 120 tablet; Refill: 0  4. Attention deficit hyperactivity disorder (ADHD), predominantly inattentive type As above. Was taking evekeo in the past.  - methylphenidate (RITALIN) 10 MG tablet; Take 1-2 tablets (10-20 mg total) by mouth 2 (two) times daily with breakfast and lunch.  Dispense: 120 tablet; Refill: 0  5. GAD (generalized anxiety disorder) Continue  sertraline.   6. Chronic fatigue Will benefit from working on increasing exercise tolerance over time. Also advised her to let her cardiologist know about HR concerns. We discussed apple watch and features including EKG and high HR alerts which may be helpful.   Will work to find long term adult PCP once she finishes college but will establish with Dr. [place] and keep her care team at [facility] for now.   [facility], FNP

## 2022-02-15 LAB — C. TRACHOMATIS/N. GONORRHOEAE RNA
C. trachomatis RNA, TMA: NOT DETECTED
N. gonorrhoeae RNA, TMA: NOT DETECTED

## 2022-02-18 ENCOUNTER — Encounter: Payer: Self-pay | Admitting: Family Medicine

## 2022-02-19 LAB — CBC WITH DIFFERENTIAL/PLATELET
Absolute Monocytes: 734 cells/uL (ref 200–950)
Basophils Absolute: 50 cells/uL (ref 0–200)
Basophils Relative: 0.7 %
Eosinophils Absolute: 158 cells/uL (ref 15–500)
Eosinophils Relative: 2.2 %
HCT: 45.8 % — ABNORMAL HIGH (ref 35.0–45.0)
Hemoglobin: 15.1 g/dL (ref 11.7–15.5)
Lymphs Abs: 2095 cells/uL (ref 850–3900)
MCH: 30.3 pg (ref 27.0–33.0)
MCHC: 33 g/dL (ref 32.0–36.0)
MCV: 92 fL (ref 80.0–100.0)
MPV: 11 fL (ref 7.5–12.5)
Monocytes Relative: 10.2 %
Neutro Abs: 4162 cells/uL (ref 1500–7800)
Neutrophils Relative %: 57.8 %
Platelets: 242 10*3/uL (ref 140–400)
RBC: 4.98 10*6/uL (ref 3.80–5.10)
RDW: 12.4 % (ref 11.0–15.0)
Total Lymphocyte: 29.1 %
WBC: 7.2 10*3/uL (ref 3.8–10.8)

## 2022-02-19 LAB — TESTOS,TOTAL,FREE AND SHBG (FEMALE)
Free Testosterone: 3.5 pg/mL (ref 0.1–6.4)
Sex Hormone Binding: 47 nmol/L (ref 17–124)
Testosterone, Total, LC-MS-MS: 36 ng/dL (ref 2–45)

## 2022-02-19 LAB — LIPID PANEL
Cholesterol: 160 mg/dL (ref <200)
HDL: 62 mg/dL (ref >=50)
LDL Cholesterol (Calc): 80 mg/dL (calc)
Non-HDL Cholesterol (Calc): 98 mg/dL (calc) (ref <130)
Total CHOL/HDL Ratio: 2.6 (calc) (ref <5.0)
Triglycerides: 92 mg/dL (ref <150)

## 2022-02-19 LAB — COMPREHENSIVE METABOLIC PANEL
AG Ratio: 1.9 (calc) (ref 1.0–2.5)
ALT: 13 U/L (ref 6–29)
AST: 13 U/L (ref 10–30)
Albumin: 4.8 g/dL (ref 3.6–5.1)
Alkaline phosphatase (APISO): 89 U/L (ref 31–125)
BUN: 11 mg/dL (ref 7–25)
CO2: 28 mmol/L (ref 20–32)
Calcium: 10.2 mg/dL (ref 8.6–10.2)
Chloride: 105 mmol/L (ref 98–110)
Creat: 0.51 mg/dL (ref 0.50–0.96)
Globulin: 2.5 g/dL (calc) (ref 1.9–3.7)
Glucose, Bld: 63 mg/dL — ABNORMAL LOW (ref 65–99)
Potassium: 4.8 mmol/L (ref 3.5–5.3)
Sodium: 142 mmol/L (ref 135–146)
Total Bilirubin: 0.9 mg/dL (ref 0.2–1.2)
Total Protein: 7.3 g/dL (ref 6.1–8.1)

## 2022-02-19 LAB — LUTEINIZING HORMONE: LH: 4.3 m[IU]/mL

## 2022-02-19 LAB — HEMOGLOBIN A1C
Hgb A1c MFr Bld: 4.8 % of total Hgb (ref <5.7)
Mean Plasma Glucose: 91 mg/dL
eAG (mmol/L): 5 mmol/L

## 2022-02-19 LAB — DHEA-SULFATE: DHEA-SO4: 283 ug/dL (ref 44–286)

## 2022-02-19 LAB — VITAMIN D 25 HYDROXY (VIT D DEFICIENCY, FRACTURES): Vit D, 25-Hydroxy: 32 ng/mL (ref 30–100)

## 2022-02-19 LAB — PROLACTIN: Prolactin: 10.2 ng/mL

## 2022-02-19 LAB — TSH+FREE T4: TSH W/REFLEX TO FT4: 1.88 mIU/L

## 2022-02-19 LAB — FOLLICLE STIMULATING HORMONE: FSH: 6.3 m[IU]/mL

## 2022-02-26 ENCOUNTER — Ambulatory Visit: Payer: Medicaid Other | Admitting: Pediatrics

## 2022-03-27 ENCOUNTER — Other Ambulatory Visit: Payer: Self-pay | Admitting: Family Medicine

## 2022-03-27 ENCOUNTER — Encounter: Payer: Self-pay | Admitting: Family Medicine

## 2022-03-27 DIAGNOSIS — Q7962 Hypermobile Ehlers-Danlos syndrome: Secondary | ICD-10-CM

## 2022-03-27 DIAGNOSIS — M25532 Pain in left wrist: Secondary | ICD-10-CM

## 2022-06-11 ENCOUNTER — Other Ambulatory Visit (HOSPITAL_COMMUNITY)
Admission: RE | Admit: 2022-06-11 | Discharge: 2022-06-11 | Disposition: A | Discharge status: Home / Self Care | Payer: Medicaid Other | Source: Ambulatory Visit | Attending: Radiology | Admitting: Radiology

## 2022-06-11 ENCOUNTER — Encounter: Payer: Self-pay | Admitting: Radiology

## 2022-06-11 ENCOUNTER — Ambulatory Visit: Payer: Medicaid Other | Admitting: Radiology

## 2022-06-11 VITALS — BP 104/72 | Ht 64.5 in | Wt 122.0 lb

## 2022-06-11 DIAGNOSIS — Z01419 Encounter for gynecological examination (general) (routine) without abnormal findings: Secondary | ICD-10-CM

## 2022-06-11 DIAGNOSIS — Z113 Encounter for screening for infections with a predominantly sexual mode of transmission: Secondary | ICD-10-CM | POA: Insufficient documentation

## 2022-06-11 DIAGNOSIS — Z308 Encounter for other contraceptive management: Secondary | ICD-10-CM

## 2022-06-11 MED ORDER — SLYND 4 MG PO TABS: 1.0000 | ORAL_TABLET | Freq: Every day | ORAL | 4 refills | Status: DC

## 2022-06-11 NOTE — Progress Notes (Signed)
Breanna Gaines [facility] 2001-06-01 [id number]   History:  21 y.o. G0 presents for annual exam as a new patient. Had precocious puberty, Nexplanon was placed at age 52, started on OCPs at age 57 and the patch at 50. She was taken off the patch by her endocrinologist because of her family hx of TIA. She is interested in a progestin only option now. No gyn concerns otherwise.  Gynecologic History Patient's last menstrual period was 05/26/2022 (exact date). Period Cycle (Days): 28 (on ocps/patch since age 58, amenorrhea with ocps/patch until stopping ocps 2 months ago) Period Duration (Days): 5 Period Pattern: Regular Menstrual Flow: Moderate (light to moderate) Menstrual Control: Tampon Dysmenorrhea: None Contraception/Family planning: condoms Sexually active: yes   Obstetric History OB History  Gravida Para Term Preterm AB Living  0 0 0 0 0 0  SAB IAB Ectopic Multiple Live Births  0 0 0 0 0     The following portions of the patient's history were reviewed and updated as appropriate: allergies, current medications, past family history, past medical history, past social history, past surgical history, and problem list.  Review of Systems Pertinent items noted in HPI and remainder of comprehensive ROS otherwise negative.   Past medical history, past surgical history, family history and social history were all reviewed and documented in the EPIC chart.   Exam:  Vitals:   06/11/22 1432  BP: 104/72  Weight: 122 lb (55.3 kg)  Height: 5' 4.5" (1.638 m)   Body mass index is 20.62 kg/m.  General appearance:  Normal Thyroid:  Symmetrical, normal in size, without palpable masses or nodularity. Respiratory  Auscultation:  Clear without wheezing or rhonchi Cardiovascular  Auscultation:  Regular rate, without rubs, murmurs or gallops  Edema/varicosities:  Not grossly evident Abdominal  Soft,nontender, without masses, guarding or rebound.  Liver/spleen:  No organomegaly  noted  Hernia:  None appreciated  Skin  Inspection:  Grossly normal Breasts: Examined lying and sitting.   Right: Without masses, retractions, nipple discharge or axillary adenopathy.   Left: Without masses, retractions, nipple discharge or axillary adenopathy. Genitourinary   Inguinal/mons:  Normal without inguinal adenopathy  External genitalia:  Normal appearing vulva with no masses, tenderness, or lesions  BUS/Urethra/Skene's glands:  Normal without masses or exudate  Vagina:  Normal appearing with normal color and discharge, no lesions  Cervix:  Normal appearing without discharge or lesions  Uterus:  Normal in size, shape and contour.  Mobile, nontender  Adnexa/parametria:     Rt: Normal in size, without masses or tenderness.   Lt: Normal in size, without masses or tenderness.  Anus and perineum: Normal   Patient informed chaperone available to be present for breast and pelvic exam. Patient has requested no chaperone to be present. Patient has been advised what will be completed during breast and pelvic exam.   Assessment/Plan:   1. Well woman exam with routine gynecological exam - Cytology - PAP( [place])  2. Encounter for other contraceptive management Discussed all progestin only options, would like to try Slynd Continue condom use - Drospirenone (SLYND) 4 MG TABS; Take 1 tablet by mouth daily.  Dispense: 84 tablet; Refill: 4  3. Screening for STDs (sexually transmitted diseases) - Cytology - PAP( [place])     Discussed SBE, pap and STI screening as directed/appropriate. Recommend of exercise weekly, including weight bearing exercise. Encouraged the use of seatbelts and sunscreen. Return in 1 year for annual or as needed.   Lorella Gomez B WHNP-BC 3:02  PM 06/11/2022

## 2022-06-12 LAB — CYTOLOGY - PAP
Chlamydia: NEGATIVE
Comment: NEGATIVE
Comment: NEGATIVE
Comment: NORMAL
Diagnosis: NEGATIVE
Neisseria Gonorrhea: NEGATIVE
Trichomonas: NEGATIVE

## 2022-11-04 ENCOUNTER — Encounter: Payer: Self-pay | Admitting: *Deleted

## 2023-05-05 ENCOUNTER — Other Ambulatory Visit: Payer: Self-pay | Admitting: Radiology

## 2023-05-05 DIAGNOSIS — Z308 Encounter for other contraceptive management: Secondary | ICD-10-CM

## 2023-05-06 NOTE — Telephone Encounter (Signed)
Med refill request: Slynd Last AEX: 06/11/22 Next AEX: none scheduled Last MMG (if hormonal med) n/a Refill authorized: Slynd #[id number], with note: needs office visit for further refills.  Sent to provider for review.

## 2023-07-28 ENCOUNTER — Ambulatory Visit: Payer: Medicaid Other | Admitting: Radiology

## 2023-08-15 IMAGING — DX DG CHEST 2V
2 series · 2 of 2 positions shown · non-contrast
Comparison: None

CLINICAL DATA: Shortness of breath and chest tightness

EXAM:
CHEST - 2 VIEW

[w chest pa]
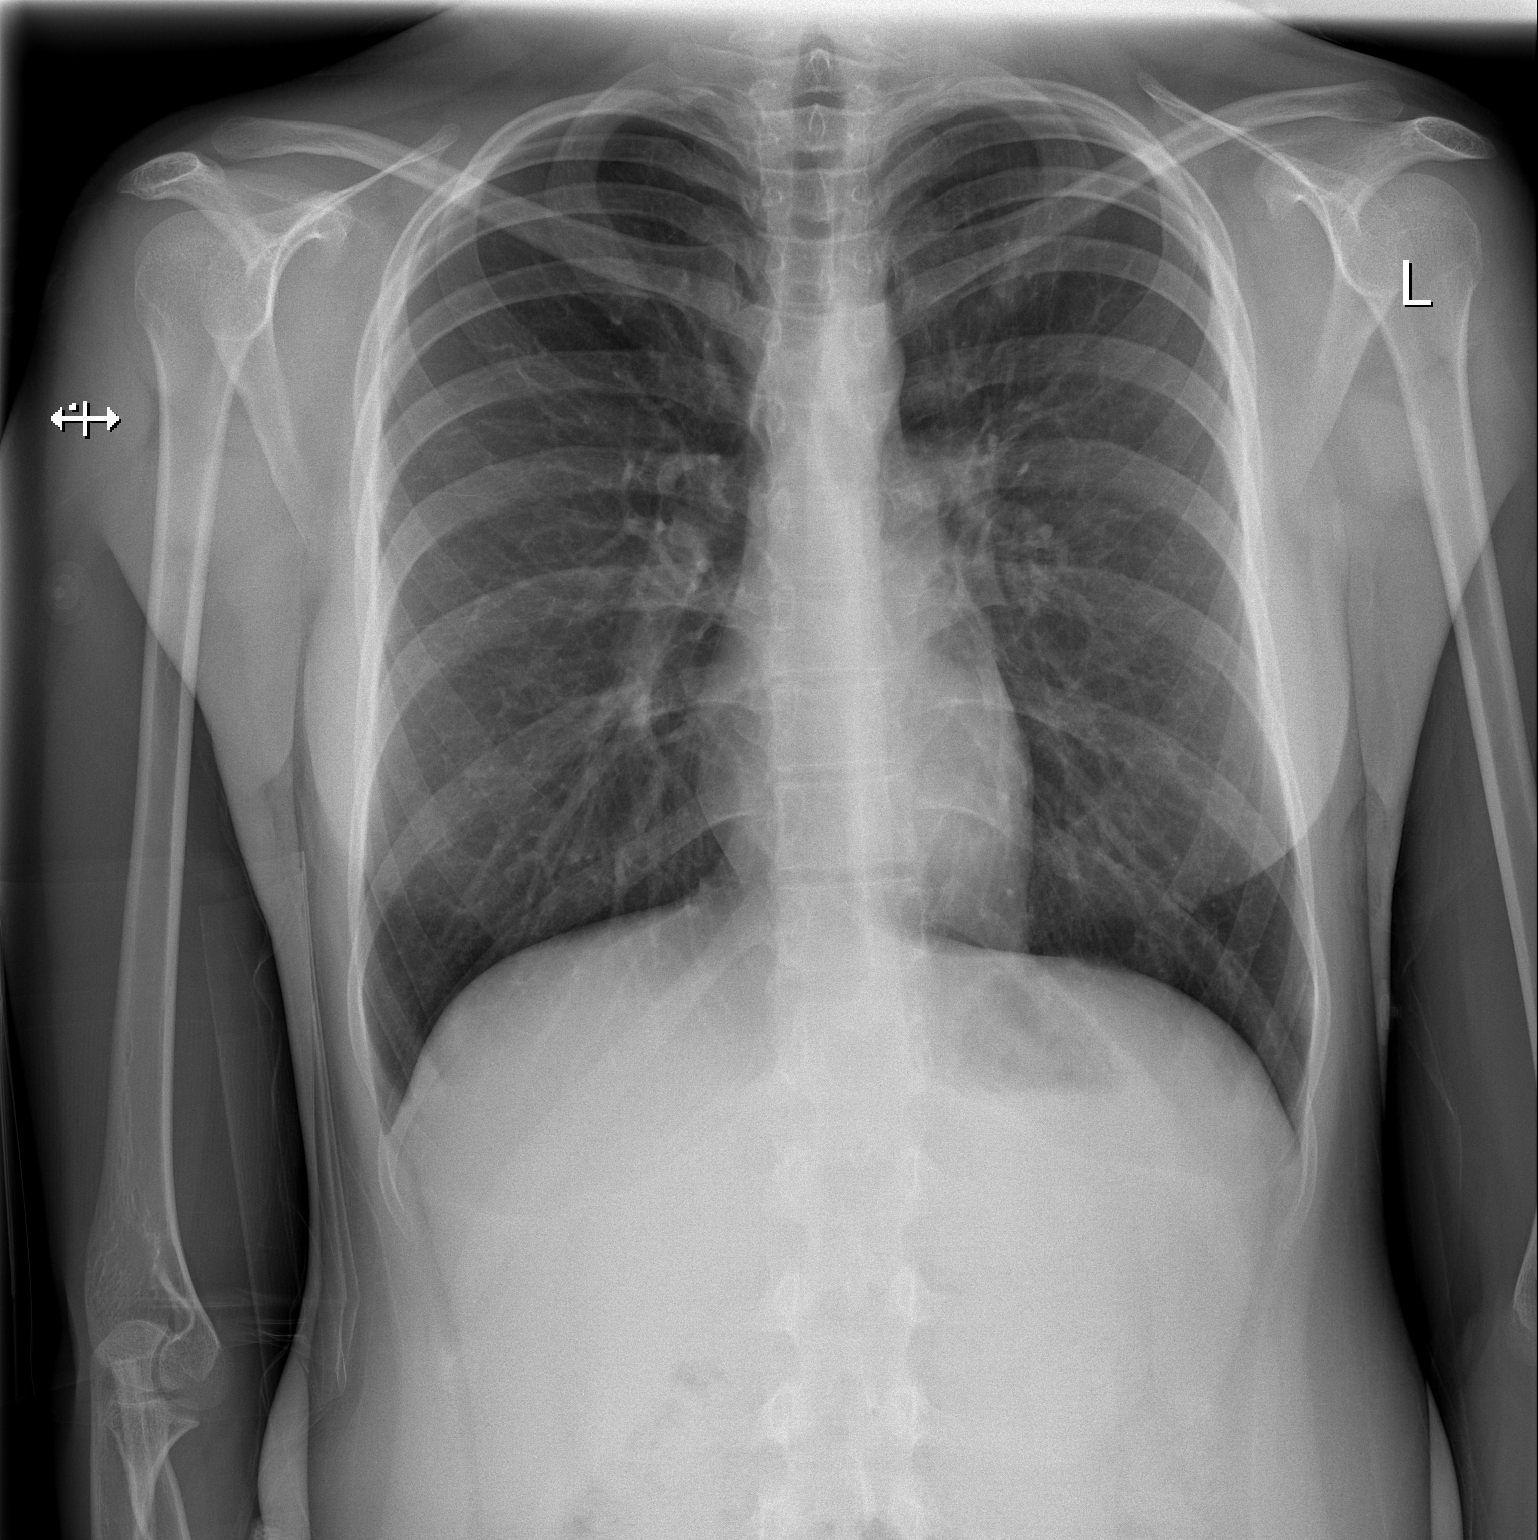

[w chest lat]
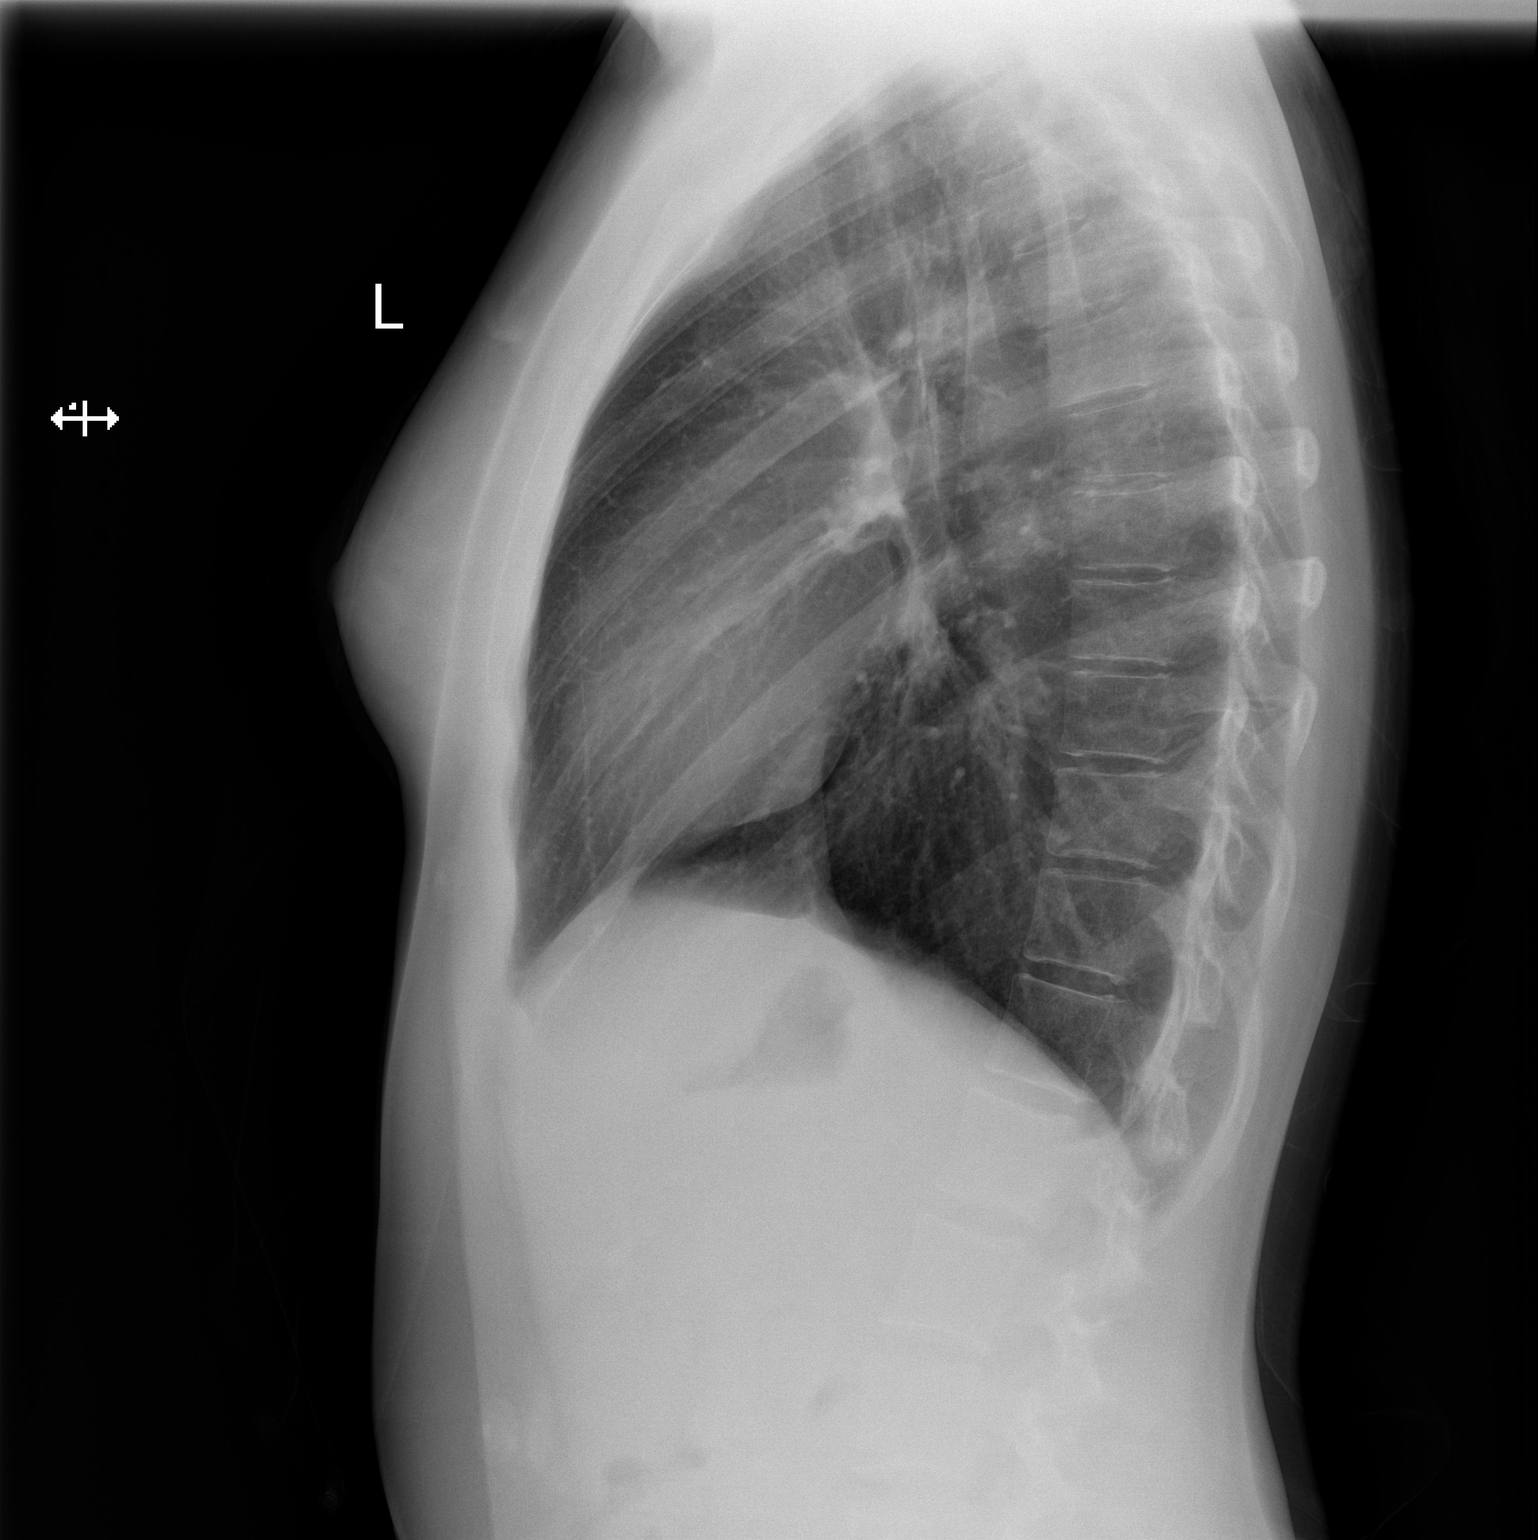

[2 of 2 positions shown; findings below may reference images not displayed]

FINDINGS: Heart size is normal. Mediastinal shadows are normal. The lungs are
clear. No bronchial thickening. No infiltrate, mass, effusion or
collapse. Pulmonary vascularity is normal. No bony abnormality.
IMPRESSION: Normal chest

## 2024-07-06 ENCOUNTER — Other Ambulatory Visit: Payer: Self-pay | Admitting: Radiology

## 2024-07-06 DIAGNOSIS — Z308 Encounter for other contraceptive management: Secondary | ICD-10-CM

## 2024-07-06 NOTE — Telephone Encounter (Signed)
 Med refill request: Slynd  Last AEX: 06/11/22 Next AEX: none scheduled Last MMG (if hormonal med) n/a Refill denied.  Needs AEX Message sent to front desk to schedule AEX

## 2024-09-02 ENCOUNTER — Ambulatory Visit: Admitting: Radiology
